# Patient Record
Sex: Male | Born: 1945 | Race: White | Hispanic: No | Marital: Married | Smoking: Former smoker
Health system: Southern US, Community
[De-identification: ages and names within clinical notes are randomized; demographics above are authoritative.]

## PROBLEM LIST (undated history)

## (undated) DIAGNOSIS — M1990 Unspecified osteoarthritis, unspecified site: Secondary | ICD-10-CM

## (undated) DIAGNOSIS — I639 Cerebral infarction, unspecified: Secondary | ICD-10-CM

## (undated) DIAGNOSIS — Z720 Tobacco use: Secondary | ICD-10-CM

## (undated) DIAGNOSIS — I714 Abdominal aortic aneurysm, without rupture, unspecified: Secondary | ICD-10-CM

## (undated) DIAGNOSIS — J449 Chronic obstructive pulmonary disease, unspecified: Secondary | ICD-10-CM

## (undated) HISTORY — PX: TONSILLECTOMY: SUR1361

## (undated) HISTORY — DX: Cerebral infarction, unspecified: I63.9

## (undated) HISTORY — DX: Chronic obstructive pulmonary disease, unspecified: J44.9

---

## 2015-06-14 ENCOUNTER — Encounter (HOSPITAL_COMMUNITY): Payer: Self-pay | Admitting: Emergency Medicine

## 2015-06-14 ENCOUNTER — Emergency Department (HOSPITAL_COMMUNITY): Payer: Medicare Other

## 2015-06-14 ENCOUNTER — Inpatient Hospital Stay (HOSPITAL_COMMUNITY)
Admission: EM | Admit: 2015-06-14 | Discharge: 2015-06-17 | DRG: 064 | Disposition: A | Discharge status: Home / Self Care | Payer: Medicare Other | Attending: Internal Medicine | Admitting: Internal Medicine

## 2015-06-14 DIAGNOSIS — J441 Chronic obstructive pulmonary disease with (acute) exacerbation: Secondary | ICD-10-CM | POA: Diagnosis not present

## 2015-06-14 DIAGNOSIS — I639 Cerebral infarction, unspecified: Secondary | ICD-10-CM | POA: Diagnosis not present

## 2015-06-14 DIAGNOSIS — F17210 Nicotine dependence, cigarettes, uncomplicated: Secondary | ICD-10-CM | POA: Diagnosis present

## 2015-06-14 DIAGNOSIS — R05 Cough: Secondary | ICD-10-CM | POA: Diagnosis present

## 2015-06-14 DIAGNOSIS — E785 Hyperlipidemia, unspecified: Secondary | ICD-10-CM | POA: Diagnosis present

## 2015-06-14 DIAGNOSIS — I10 Essential (primary) hypertension: Secondary | ICD-10-CM | POA: Diagnosis present

## 2015-06-14 DIAGNOSIS — R059 Cough, unspecified: Secondary | ICD-10-CM | POA: Diagnosis present

## 2015-06-14 DIAGNOSIS — R9431 Abnormal electrocardiogram [ECG] [EKG]: Secondary | ICD-10-CM | POA: Diagnosis present

## 2015-06-14 DIAGNOSIS — G9340 Encephalopathy, unspecified: Secondary | ICD-10-CM | POA: Diagnosis not present

## 2015-06-14 DIAGNOSIS — R4182 Altered mental status, unspecified: Secondary | ICD-10-CM | POA: Diagnosis not present

## 2015-06-14 DIAGNOSIS — R030 Elevated blood-pressure reading, without diagnosis of hypertension: Secondary | ICD-10-CM

## 2015-06-14 DIAGNOSIS — Z7982 Long term (current) use of aspirin: Secondary | ICD-10-CM

## 2015-06-14 DIAGNOSIS — J449 Chronic obstructive pulmonary disease, unspecified: Secondary | ICD-10-CM | POA: Diagnosis present

## 2015-06-14 DIAGNOSIS — IMO0001 Reserved for inherently not codable concepts without codable children: Secondary | ICD-10-CM | POA: Diagnosis present

## 2015-06-14 DIAGNOSIS — R410 Disorientation, unspecified: Secondary | ICD-10-CM | POA: Diagnosis not present

## 2015-06-14 DIAGNOSIS — Z720 Tobacco use: Secondary | ICD-10-CM

## 2015-06-14 DIAGNOSIS — E860 Dehydration: Secondary | ICD-10-CM | POA: Diagnosis present

## 2015-06-14 DIAGNOSIS — F17200 Nicotine dependence, unspecified, uncomplicated: Secondary | ICD-10-CM | POA: Diagnosis not present

## 2015-06-14 HISTORY — DX: Tobacco use: Z72.0

## 2015-06-14 LAB — COMPREHENSIVE METABOLIC PANEL
ALK PHOS: 76 U/L (ref 38–126)
ALT: 12 U/L — AB (ref 17–63)
AST: 18 U/L (ref 15–41)
Albumin: 4.6 g/dL (ref 3.5–5.0)
Anion gap: 11 (ref 5–15)
BILIRUBIN TOTAL: 0.6 mg/dL (ref 0.3–1.2)
BUN: 17 mg/dL (ref 6–20)
CALCIUM: 10.4 mg/dL — AB (ref 8.9–10.3)
CO2: 29 mmol/L (ref 22–32)
CREATININE: 0.83 mg/dL (ref 0.61–1.24)
Chloride: 104 mmol/L (ref 101–111)
Glucose, Bld: 149 mg/dL — ABNORMAL HIGH (ref 65–99)
Potassium: 4.3 mmol/L (ref 3.5–5.1)
Sodium: 144 mmol/L (ref 135–145)
Total Protein: 8 g/dL (ref 6.5–8.1)

## 2015-06-14 LAB — CBC
HCT: 48.3 % (ref 39.0–52.0)
Hemoglobin: 15.7 g/dL (ref 13.0–17.0)
MCH: 31.3 pg (ref 26.0–34.0)
MCHC: 32.5 g/dL (ref 30.0–36.0)
MCV: 96.4 fL (ref 78.0–100.0)
PLATELETS: 281 10*3/uL (ref 150–400)
RBC: 5.01 MIL/uL (ref 4.22–5.81)
RDW: 13.3 % (ref 11.5–15.5)
WBC: 9.3 10*3/uL (ref 4.0–10.5)

## 2015-06-14 LAB — URINALYSIS, ROUTINE W REFLEX MICROSCOPIC
Glucose, UA: NEGATIVE mg/dL
HGB URINE DIPSTICK: NEGATIVE
Ketones, ur: 15 mg/dL — AB
Leukocytes, UA: NEGATIVE
NITRITE: NEGATIVE
PH: 5.5 (ref 5.0–8.0)
Protein, ur: NEGATIVE mg/dL
SPECIFIC GRAVITY, URINE: 1.034 — AB (ref 1.005–1.030)

## 2015-06-14 LAB — TROPONIN I

## 2015-06-14 LAB — CBG MONITORING, ED: GLUCOSE-CAPILLARY: 118 mg/dL — AB (ref 65–99)

## 2015-06-14 LAB — BRAIN NATRIURETIC PEPTIDE: B Natriuretic Peptide: 175.9 pg/mL — ABNORMAL HIGH (ref 0.0–100.0)

## 2015-06-14 LAB — I-STAT CG4 LACTIC ACID, ED: Lactic Acid, Venous: 0.78 mmol/L (ref 0.5–2.0)

## 2015-06-14 LAB — PROTIME-INR
INR: 1.05 (ref 0.00–1.49)
PROTHROMBIN TIME: 13.9 s (ref 11.6–15.2)

## 2015-06-14 NOTE — ED Provider Notes (Signed)
CSN: [id number]     Arrival date & time 06/14/15  2011 History   First MD Initiated Contact with Patient 06/14/15 2147     Chief Complaint  Patient presents with  . Altered Mental Status  . Urinary Tract Infection    possible      (Consider location/radiation/quality/duration/timing/severity/associated sxs/prior Treatment) HPI Patient has seemed very tired and had periods of confusion since Sunday evening. The patient's wife reports that normally he is active and alert. He has done things like forgetting to turn off the television or unplug things that he normally would do. She reports that he also seemed somewhat confused in relating a rather simple recent event. Today he had difficulty using the television remote. There is not been a fever however he has been less active. His wife wonders if he has a UTI. The patient has noticed his urine to be dark or possibly of blood in it with some burning with urination. He is also had some mild headache has been coming and going. He denies any headache at this time. He denies of the headache localized. He reports it was "just a headache". There is been no vomiting or diarrhea. The patient is a baseline heavy smoker. His wife reports that he has not smoked for 2 days (NOT NORMAL FOR HIM). He has also had wet sounding cough. Patient denies chest pain. Not noted any rashes, there is been no swelling of the extremities or joints. Past Medical History  Diagnosis Date  . Tobacco abuse    History reviewed. No pertinent past surgical history. No family history on file. Social History  Substance Use Topics  . Smoking status: Current Every Day Smoker  . Smokeless tobacco: None  . Alcohol Use: None    Review of Systems  10 Systems reviewed and are negative for acute change except as noted in the HPI.   Allergies  Review of patient's allergies indicates no known allergies.  Home Medications   Prior to Admission medications   Medication Sig Start  Date End Date Taking? Authorizing Provider  aspirin EC 81 MG tablet Take 81 mg by mouth daily.   Yes Historical Provider, MD  Multiple Vitamins-Minerals (MULTIVITAMIN & MINERAL PO) Take 1 tablet by mouth daily.   Yes Historical Provider, MD   BP 154/95 mmHg  Pulse 83  Temp(Src) 98.2 F (36.8 C) (Oral)  Resp 14  SpO2 95% Physical Exam  Constitutional: He appears well-developed and well-nourished.  The patient is nontoxic and alert. He does not have acute respiratory distress.  HENT:  Head: Normocephalic and atraumatic.  Bilateral TMs no erythema or bulging. Posterior oropharynx is patent without erythema or exudate.  Eyes: EOM are normal. Pupils are equal, round, and reactive to light.  Neck: Neck supple.  Cardiovascular: Normal rate, regular rhythm, normal heart sounds and intact distal pulses.   Pulmonary/Chest: Effort normal.  Very soft breath sounds with poor air flow to the bases. With forced expiration wheeze. Patient has intermittent wet sounding cough.  Abdominal: Soft. Bowel sounds are normal. He exhibits no distension. There is no tenderness.  Musculoskeletal: Normal range of motion. He exhibits no edema or tenderness.  Neurological: He is alert. He has normal strength. Coordination normal. GCS eye subscore is 4. GCS verbal subscore is 5. GCS motor subscore is 6.  Patient does seem mildly confused. He answers questions but defers details to his wife. His speech seems somewhat slowed and with delayed word finding. Cranial nerves II through XII intact. Motor strength  intact 4 extremities. Intact to light touch 4 extremities and face.  Skin: Skin is warm, dry and intact.  Psychiatric: He has a normal mood and affect.    ED Course  Procedures (including critical care time) Labs Review Labs Reviewed  COMPREHENSIVE METABOLIC PANEL - Abnormal; Notable for the following:    Glucose, Bld 149 (*)    Calcium 10.4 (*)    ALT 12 (*)    All other components within normal limits   BRAIN NATRIURETIC PEPTIDE - Abnormal; Notable for the following:    B Natriuretic Peptide 175.9 (*)    All other components within normal limits  URINALYSIS, ROUTINE W REFLEX MICROSCOPIC (NOT AT [facility]) - Abnormal; Notable for the following:    Specific Gravity, Urine 1.034 (*)    Bilirubin Urine SMALL (*)    Ketones, ur 15 (*)    All other components within normal limits  CBG MONITORING, ED - Abnormal; Notable for the following:    Glucose-Capillary 118 (*)    All other components within normal limits  CULTURE, BLOOD (ROUTINE X 2)  CULTURE, BLOOD (ROUTINE X 2)  CBC  TROPONIN I  PROTIME-INR  URINE RAPID DRUG SCREEN, HOSP PERFORMED  BLOOD GAS, ARTERIAL  AMMONIA  I-STAT CG4 LACTIC ACID, ED    Imaging Review Dg Chest 2 View  06/14/2015  CLINICAL DATA:  70 year old male with cough EXAM: CHEST  2 VIEW COMPARISON:  None. FINDINGS: The heart size and mediastinal contours are within normal limits. Both lungs are clear. The visualized skeletal structures are unremarkable. IMPRESSION: No active cardiopulmonary disease. Electronically Signed   By: Elgie Collard M.D.   On: 06/14/2015 23:17   Ct Head Wo Contrast  06/14/2015  CLINICAL DATA:  70 year old male with altered mental status EXAM: CT HEAD WITHOUT CONTRAST TECHNIQUE: Contiguous axial images were obtained from the base of the skull through the vertex without intravenous contrast. COMPARISON:  None. FINDINGS: The ventricles are dilated and the sulci are prominent compatible with age-related atrophy. Periventricular and deep white matter hypodensities represent chronic microvascular ischemic changes. Bilateral basal ganglia hypodensities compatible with old infarcts. There is no intracranial hemorrhage. No mass effect or midline shift identified. In there is mild mucoperiosteal thickening of the paranasal sinuses. The mastoid air cells are well aerated. Calvarium is intact. IMPRESSION: No acute intracranial hemorrhage. Age-related atrophy and  chronic microvascular ischemic disease. If symptoms persist and there are no contraindications, MRI may provide better evaluation if clinically indicated. Electronically Signed   By: Elgie Collard M.D.   On: 06/14/2015 23:21   I have personally reviewed and evaluated these images and lab results as part of my medical decision-making.   EKG Interpretation   Date/Time:  Wednesday June 14 2015 20:25:23 EST Ventricular Rate:  94 PR Interval:  180 QRS Duration: 90 QT Interval:  339 QTC Calculation: 424 R Axis:   47 Text Interpretation:  Sinus rhythm Ventricular bigeminy Repol abnrm  suggests ischemia, inferior leads Baseline wander in lead(s) II III aVF  need repeat for baseline.no STEMI. Confirmed by Donnald Garre, MD, Lebron Conners  [phone]) on 06/15/2015 12:15:24 AM     Consult: Patient's case is been reviewed with Dr. Lu Duffel. Lu Duffel advises to obtain MRI and admit for mental status change. Consult: Patient's case reviewed with Dr. Youlanda Roys for admission. MDM   Final diagnoses:  Altered mental status, unspecified altered mental status type  Chronic obstructive pulmonary disease, unspecified COPD type (HCC)   Patient presents with waxing and waning mental status change over the  past 2-1/2 days. At this time patient has no localizing motor deficit. CT is not showing any acute infarct. Patient's case was reviewed Dr. Lu Duffel at this time we will admit the patient with mental status change for rule out a CVA versus TIA. Currently no infectious sources evident. Chest x-ray does not show focal pneumonia, patient does not have leukocytosis or elevated lactic acid and urinalysis is negative. There are no foci of cellulitis or apparent wounds. Time will be for ongoing monitoring and additional diagnostic evaluation.    Arby Barrette, MD 06/15/15 [phone]

## 2015-06-14 NOTE — ED Notes (Signed)
Pt from home. Wife thinks he has a UTI because pt has been lethargic and confused since Monday. Unremarkable neuro assessment. Pt has equal strength on both sides and equal dexterity. Pt has complaints of blood in his urine and burning when urinating. Pt has had complaints on headaches on and off since Monday.

## 2015-06-14 NOTE — ED Notes (Signed)
UANBLE TO COLLECT LABS AT THIS TIME PATIENT IS IN THE RESTROOM.

## 2015-06-15 ENCOUNTER — Inpatient Hospital Stay (HOSPITAL_COMMUNITY): Payer: Medicare Other

## 2015-06-15 ENCOUNTER — Encounter (HOSPITAL_COMMUNITY): Payer: Self-pay | Admitting: Internal Medicine

## 2015-06-15 DIAGNOSIS — IMO0001 Reserved for inherently not codable concepts without codable children: Secondary | ICD-10-CM | POA: Diagnosis present

## 2015-06-15 DIAGNOSIS — J449 Chronic obstructive pulmonary disease, unspecified: Secondary | ICD-10-CM | POA: Diagnosis present

## 2015-06-15 DIAGNOSIS — E860 Dehydration: Secondary | ICD-10-CM | POA: Diagnosis present

## 2015-06-15 DIAGNOSIS — I6789 Other cerebrovascular disease: Secondary | ICD-10-CM | POA: Diagnosis not present

## 2015-06-15 DIAGNOSIS — R05 Cough: Secondary | ICD-10-CM

## 2015-06-15 DIAGNOSIS — G9340 Encephalopathy, unspecified: Secondary | ICD-10-CM | POA: Diagnosis present

## 2015-06-15 DIAGNOSIS — J441 Chronic obstructive pulmonary disease with (acute) exacerbation: Secondary | ICD-10-CM | POA: Diagnosis present

## 2015-06-15 DIAGNOSIS — R9431 Abnormal electrocardiogram [ECG] [EKG]: Secondary | ICD-10-CM | POA: Diagnosis not present

## 2015-06-15 DIAGNOSIS — I10 Essential (primary) hypertension: Secondary | ICD-10-CM | POA: Diagnosis present

## 2015-06-15 DIAGNOSIS — I639 Cerebral infarction, unspecified: Secondary | ICD-10-CM | POA: Diagnosis not present

## 2015-06-15 DIAGNOSIS — R030 Elevated blood-pressure reading, without diagnosis of hypertension: Secondary | ICD-10-CM

## 2015-06-15 DIAGNOSIS — I63032 Cerebral infarction due to thrombosis of left carotid artery: Secondary | ICD-10-CM | POA: Diagnosis not present

## 2015-06-15 DIAGNOSIS — R059 Cough, unspecified: Secondary | ICD-10-CM | POA: Diagnosis present

## 2015-06-15 DIAGNOSIS — F17210 Nicotine dependence, cigarettes, uncomplicated: Secondary | ICD-10-CM | POA: Diagnosis present

## 2015-06-15 DIAGNOSIS — Z7982 Long term (current) use of aspirin: Secondary | ICD-10-CM | POA: Diagnosis not present

## 2015-06-15 DIAGNOSIS — Z720 Tobacco use: Secondary | ICD-10-CM

## 2015-06-15 DIAGNOSIS — R4182 Altered mental status, unspecified: Secondary | ICD-10-CM | POA: Diagnosis not present

## 2015-06-15 DIAGNOSIS — E785 Hyperlipidemia, unspecified: Secondary | ICD-10-CM | POA: Diagnosis present

## 2015-06-15 DIAGNOSIS — I63031 Cerebral infarction due to thrombosis of right carotid artery: Secondary | ICD-10-CM | POA: Diagnosis not present

## 2015-06-15 LAB — BLOOD GAS, ARTERIAL
Acid-Base Excess: 1.3 mmol/L (ref 0.0–2.0)
Bicarbonate: 25 mEq/L — ABNORMAL HIGH (ref 20.0–24.0)
DRAWN BY: 232811
FIO2: 0.21
O2 Saturation: 94.2 %
PH ART: 7.433 (ref 7.350–7.450)
Patient temperature: 98.2
TCO2: 21.9 mmol/L (ref 0–100)
pCO2 arterial: 38 mmHg (ref 35.0–45.0)
pO2, Arterial: 71 mmHg — ABNORMAL LOW (ref 80.0–100.0)

## 2015-06-15 LAB — RAPID URINE DRUG SCREEN, HOSP PERFORMED
Amphetamines: NOT DETECTED
BARBITURATES: NOT DETECTED
BENZODIAZEPINES: NOT DETECTED
COCAINE: NOT DETECTED
OPIATES: NOT DETECTED
TETRAHYDROCANNABINOL: NOT DETECTED

## 2015-06-15 LAB — EXPECTORATED SPUTUM ASSESSMENT W REFEX TO RESP CULTURE

## 2015-06-15 LAB — TROPONIN I: Troponin I: <0.03 ng/mL (ref <0.031)

## 2015-06-15 LAB — EXPECTORATED SPUTUM ASSESSMENT W GRAM STAIN, RFLX TO RESP C

## 2015-06-15 LAB — APTT: APTT: 44 s — AB (ref 24–37)

## 2015-06-15 LAB — MAGNESIUM: MAGNESIUM: 1.8 mg/dL (ref 1.7–2.4)

## 2015-06-15 LAB — LIPID PANEL
Cholesterol: 204 mg/dL — ABNORMAL HIGH (ref 0–200)
HDL: 37 mg/dL — ABNORMAL LOW (ref >40)
LDL CALC: 150 mg/dL — AB (ref 0–99)
TRIGLYCERIDES: 86 mg/dL (ref <150)
Total CHOL/HDL Ratio: 5.5 RATIO
VLDL: 17 mg/dL (ref 0–40)

## 2015-06-15 LAB — AMMONIA: AMMONIA: 25 umol/L (ref 9–35)

## 2015-06-15 MED ORDER — METOPROLOL TARTRATE 12.5 MG HALF TABLET
12.5000 mg | ORAL_TABLET | Freq: Two times a day (BID) | ORAL | Status: DC
  Administered 2015-06-16: 12.5 mg via ORAL
  Filled 2015-06-15: qty 1

## 2015-06-15 MED ORDER — AZITHROMYCIN 500 MG IV SOLR
500.0000 mg | INTRAVENOUS | Status: DC
  Administered 2015-06-15 – 2015-06-16 (×2): 500 mg via INTRAVENOUS
  Filled 2015-06-15 (×3): qty 500

## 2015-06-15 MED ORDER — STROKE: EARLY STAGES OF RECOVERY BOOK
Freq: Once | Status: AC
  Administered 2015-06-15: 10:00:00
  Filled 2015-06-15: qty 1

## 2015-06-15 MED ORDER — MULTIVITAMIN & MINERAL PO LIQD: 1.0000 | Freq: Every day | ORAL | Status: DC

## 2015-06-15 MED ORDER — ENOXAPARIN SODIUM 40 MG/0.4ML ~~LOC~~ SOLN
40.0000 mg | SUBCUTANEOUS | Status: DC
  Administered 2015-06-15 – 2015-06-17 (×3): 40 mg via SUBCUTANEOUS
  Filled 2015-06-15 (×3): qty 0.4

## 2015-06-15 MED ORDER — LORAZEPAM 2 MG/ML IJ SOLN
1.0000 mg | Freq: Once | INTRAMUSCULAR | Status: AC
  Administered 2015-06-15: 1 mg via INTRAVENOUS
  Filled 2015-06-15: qty 1

## 2015-06-15 MED ORDER — ALBUTEROL SULFATE (2.5 MG/3ML) 0.083% IN NEBU: 2.5000 mg | INHALATION_SOLUTION | RESPIRATORY_TRACT | Status: DC | PRN

## 2015-06-15 MED ORDER — HYDROMORPHONE HCL 1 MG/ML IJ SOLN: 0.5000 mg | Freq: Four times a day (QID) | INTRAMUSCULAR | Status: DC | PRN

## 2015-06-15 MED ORDER — AZITHROMYCIN 250 MG PO TABS: 500.0000 mg | ORAL_TABLET | Freq: Every day | ORAL | Status: DC

## 2015-06-15 MED ORDER — SENNOSIDES-DOCUSATE SODIUM 8.6-50 MG PO TABS: 1.0000 | ORAL_TABLET | Freq: Every evening | ORAL | Status: DC | PRN

## 2015-06-15 MED ORDER — ASPIRIN 325 MG PO TABS
325.0000 mg | ORAL_TABLET | Freq: Every day | ORAL | Status: DC
  Administered 2015-06-16: 325 mg via ORAL
  Filled 2015-06-15: qty 1

## 2015-06-15 MED ORDER — ASPIRIN 300 MG RE SUPP
300.0000 mg | Freq: Every day | RECTAL | Status: DC
  Administered 2015-06-15: 300 mg via RECTAL
  Filled 2015-06-15: qty 1

## 2015-06-15 MED ORDER — ACETAMINOPHEN 325 MG PO TABS: 650.0000 mg | ORAL_TABLET | Freq: Four times a day (QID) | ORAL | Status: DC | PRN

## 2015-06-15 MED ORDER — ASPIRIN EC 81 MG PO TBEC
81.0000 mg | DELAYED_RELEASE_TABLET | Freq: Once | ORAL | Status: AC
  Administered 2015-06-15: 81 mg via ORAL
  Filled 2015-06-15: qty 1

## 2015-06-15 MED ORDER — STROKE: EARLY STAGES OF RECOVERY BOOK
Freq: Once | Status: DC
  Filled 2015-06-15 (×2): qty 1

## 2015-06-15 MED ORDER — AZITHROMYCIN 250 MG PO TABS: 250.0000 mg | ORAL_TABLET | Freq: Every day | ORAL | Status: DC

## 2015-06-15 MED ORDER — DM-GUAIFENESIN ER 30-600 MG PO TB12
1.0000 | ORAL_TABLET | Freq: Two times a day (BID) | ORAL | Status: DC
  Administered 2015-06-16 – 2015-06-17 (×3): 1 via ORAL
  Filled 2015-06-15 (×6): qty 1

## 2015-06-15 MED ORDER — HYDRALAZINE HCL 20 MG/ML IJ SOLN: 5.0000 mg | INTRAMUSCULAR | Status: DC | PRN

## 2015-06-15 MED ORDER — NICOTINE 21 MG/24HR TD PT24
21.0000 mg | MEDICATED_PATCH | Freq: Every day | TRANSDERMAL | Status: DC
  Administered 2015-06-15 – 2015-06-17 (×3): 21 mg via TRANSDERMAL
  Filled 2015-06-15 (×3): qty 1

## 2015-06-15 MED ORDER — METHYLPREDNISOLONE SODIUM SUCC 125 MG IJ SOLR
60.0000 mg | Freq: Two times a day (BID) | INTRAMUSCULAR | Status: DC
  Administered 2015-06-15 – 2015-06-17 (×5): 60 mg via INTRAVENOUS
  Filled 2015-06-15 (×5): qty 2

## 2015-06-15 MED ORDER — IOHEXOL 350 MG/ML SOLN
150.0000 mL | Freq: Once | INTRAVENOUS | Status: AC | PRN
  Administered 2015-06-15: 75 mL via INTRAVENOUS

## 2015-06-15 MED ORDER — SODIUM CHLORIDE 0.9 % IV SOLN
INTRAVENOUS | Status: DC
  Administered 2015-06-15 (×2): via INTRAVENOUS

## 2015-06-15 MED ORDER — MAGNESIUM SULFATE 2 GM/50ML IV SOLN
2.0000 g | Freq: Once | INTRAVENOUS | Status: AC
  Administered 2015-06-15: 2 g via INTRAVENOUS
  Filled 2015-06-15: qty 50

## 2015-06-15 MED ORDER — IPRATROPIUM-ALBUTEROL 0.5-2.5 (3) MG/3ML IN SOLN
3.0000 mL | Freq: Three times a day (TID) | RESPIRATORY_TRACT | Status: DC
  Administered 2015-06-15 – 2015-06-16 (×4): 3 mL via RESPIRATORY_TRACT
  Filled 2015-06-15 (×4): qty 3

## 2015-06-15 MED ORDER — ADULT MULTIVITAMIN W/MINERALS CH
1.0000 | ORAL_TABLET | Freq: Every day | ORAL | Status: DC
  Administered 2015-06-16 – 2015-06-17 (×2): 1 via ORAL
  Filled 2015-06-15 (×2): qty 1

## 2015-06-15 MED ORDER — IPRATROPIUM-ALBUTEROL 0.5-2.5 (3) MG/3ML IN SOLN
3.0000 mL | RESPIRATORY_TRACT | Status: DC
  Administered 2015-06-15: 3 mL via RESPIRATORY_TRACT
  Filled 2015-06-15: qty 3

## 2015-06-15 NOTE — ED Notes (Signed)
Nurse drawing labs. 

## 2015-06-15 NOTE — Progress Notes (Signed)
Patient seen and examined, discussed with wife and daughter by the bedside  Altered mental status since Monday associated with a headache, slurred speech   Has not seen a PCP in 5 years  In the ER found to have acute CVA, shortness of breath, chest x-ray negative, CT head negative,ABG showed pH 7.433, PCO2 25 and PO2 71.  Had an MRI for confusion, found to have an acute CVA, neurology consulted  Plan #1 admit patient to [facility] for acute CVA, stroke order set initiated, on aspirin 81 mg at home, transition to Plavix?. Dr. Petra Kuba notified #2 COPD exacerbation, started patient on azithromycin 5 days, SLP evaluation for possible aspiration #3 hypertension-probably uncontrolled to begin with, will allow permissive hypertension in the setting of acute CVA, #4 dyslipidemia-start patient on a statin, LDL 150.

## 2015-06-15 NOTE — ED Notes (Signed)
Pt transported to MRI 

## 2015-06-15 NOTE — Progress Notes (Signed)
Patient listed as having Medicare insurance without a pcp.  EDCM spoke to patient and his family at bedside.  Patient confirms his pcp is located at [facility] at [place].  System updated.

## 2015-06-15 NOTE — ED Notes (Signed)
Paged MD concerning MRI results and new headache

## 2015-06-15 NOTE — Progress Notes (Signed)
Echocardiogram 2D Echocardiogram has been performed.  Jacob Mckenzie 06/15/2015, 3:16 PM

## 2015-06-15 NOTE — Progress Notes (Signed)
Order for swallow evaluation received due to concerns for possible aspiration. Given neurological event and aspiration concerns, rec proceed with MBS in am 06/16/15.  Xray made aware and orders placed.  SLP phoned ED at [phone] at [phone] but call was not answered.   Donavan Burnet, MS [facility] SLP [phone]

## 2015-06-15 NOTE — Progress Notes (Signed)
Patient transported to 5M17 via [facility]. Patient denies any pain. Patient placed on tele box 5M17. Bed alarm on and call bell within reach. Will continue to monitor.

## 2015-06-15 NOTE — Consult Note (Signed)
Neurology Consultation Reason for Consult: Stroke Referring Physician: Susie Cassette, N  CC: Altered mental status  History is obtained from: Wife  HPI: Jacob Mckenzie is a 70 y.o. male with a history of heavy tobacco abuse who presents with several days of altered mental status. He complained of being lightheaded on Sunday, but did not have any confusion. On Monday, his wife began noticing that he would do things that were bizarre. He would say things which were not pertinent to the situation at hand or were completely nonsensical in current context. This has been persistent and therefore yesterday, his wife took him into an urgent care who referred him in to  the emergency department.   LKW: Monday tpa given?: no, out of window Premorbid modified rankin scale: 0  ROS: A 14 point ROS was performed and is negative except as noted in the HPI.   Past Medical History  Diagnosis Date  . Tobacco abuse     FHx: Sister-cancer No history of stroke   Social History:  reports that he quit smoking 2 days ago. He does not have any smokeless tobacco history on file. He reports that he does not drink alcohol or use illicit drugs.   Exam: Current vital signs: BP 159/97 mmHg  Pulse 117  Temp(Src) 98.2 F (36.8 C) (Oral)  Resp 24  SpO2 93% Vital signs in last 24 hours: Temp:  [98.2 F (36.8 C)] 98.2 F (36.8 C) (01/19 0251) Pulse Rate:  [48-117] 117 (01/19 0740) Resp:  [14-27] 24 (01/19 0740) BP: (142-169)/(87-137) 159/97 mmHg (01/19 0740) SpO2:  [92 %-97 %] 93 % (01/19 0740)   Physical Exam  Constitutional: Appears well-developed and well-nourished.  Psych: Affect appropriate to situation Eyes: No scleral injection HENT: No OP obstrucion Head: Normocephalic.  Cardiovascular: Normal rate and regular rhythm.  Respiratory: Effort normal and breath sounds normal to anterior ascultation GI: Soft.  No distension. There is no tenderness.  Skin: WDI  Neuro: Mental Status: Patient is  awake, alert, oriented to person, place, month, year, and situation. Patient is able to give a clear and coherent history. No signs of aphasia or neglect He gives the number of quarters in $2.75 as 15 Cranial Nerves: II: Visual Fields are full. No signs of visual neglect Pupils are mildly unequal with the left very slightly larger, round, and reactive to light.   III,IV, VI: EOMI without ptosis or diploplia.  V: Facial sensation is symmetric to temperature VII: Facial movement is symmetric.  VIII: hearing is intact to voice X: Uvula elevates symmetrically XI: Shoulder shrug is symmetric. XII: tongue is midline without atrophy or fasciculations.  Motor: Tone is normal. Bulk is normal. 5/5 strength was present in all four extremities.  Sensory: Sensation is symmetric to light touch and temperature in the arms and legs. No signs of extinction to double simultaneous stimulation Cerebellar: FNF with mild postural and intentional tremor   I have reviewed labs in epic and the results pertinent to this consultation are: LDL-150  I have reviewed the images obtained: MRI brain-subcortical infarct on the right  Impression: 70 year old male with subcortical infarct which I suspect is due to untreated small vessel risk factors including heavy tobacco use, possible hypertension, hyperlippdemia. He'll need to be assessed by therapy and have secondary stroke prevention measures instituted.  Recommendations: 1. HgbA1c,  2. CTA head and neck.  3. Frequent neuro checks 4. Echocardiogram 5. Start statin therapy.  6. Prophylactic therapy-Antiplatelet med: Aspirin - dose  PO or  PR 7.  Risk factor modification 8. Telemetry monitoring 9. PT consult, OT consult, Speech consult    Ritta Slot, MD [facility] [phone]  If 7pm- 7am, please page neurology on call as listed in AMION.

## 2015-06-15 NOTE — ED Notes (Addendum)
Incorrect hosp. Orders, RN is correcting mistake, will reassess blood orders later

## 2015-06-15 NOTE — H&P (Signed)
[facility] History and Physical  [name] ZOX:[id number] DOB: Sep 19, 1945 DOA: 06/14/2015  Referring physician: ED physician PCP: No PCP Per Patient  Specialists:   Chief Complaint: Altered mental status, cough  HPI: Jacob Mckenzie is a 70 y.o. male with PMH of tobacco abuse, who presents with altered mental status and a cough.  The patient's patient's wife, he has been confused in the past 3 days. No unilateral weakness, numbness or tingling sensations. He has mild intermittent headache, but no vision change or hearing loss. No neck stiffness. Patient does not have fever or chills. Patient's wife think patient may have UTI, since she noticed possible blood in urine. Patient has a cough with yellow colored sputum production. No chest pain, shortness of breath, fever or chills. Patient does not have abdominal pain, diarrhea. Per patient's wife, patient has smoked 55 years, and quit smoking 2 days ago.  In ED, patient was found to have calcium 10.4, lactate 0.78, INR1.05, troponin negative, ammonium level 25, BNP 175, negative UDS, negative urinalysis, WBC 9.3, temperature normal, no tachycardia, electrolytes and renal function okay, negative chest x-ray, negative CT-head for acute abnormalities. ABG showed pH 7.433, PCO2 25 and PO2 71. Patient is admitted to inpatient for further eval and treatment. Neurology was consulted by EDP.  EKG: Independently reviewed. QTC 424, ventricular bigeminy.  Where does patient live?   At home    Can patient participate in ADLs?  Some   Review of Systems: Could not be reviewed accurately due to altered mental status Allergy: No Known Allergies  Past Medical History  Diagnosis Date  . Tobacco abuse     History reviewed. No pertinent past surgical history.  Social History:  reports that he quit smoking 2 days ago. He does not have any smokeless tobacco history on file. He reports that he does not drink alcohol or use illicit drugs.  Family  History: Could not be reviewed accurately due to altered mental status.  Prior to Admission medications   Medication Sig Start Date End Date Taking? Authorizing Provider  aspirin EC 81 MG tablet Take 81 mg by mouth daily.   Yes Historical Provider, MD  Multiple Vitamins-Minerals (MULTIVITAMIN & MINERAL PO) Take 1 tablet by mouth daily.   Yes Historical Provider, MD    Physical Exam: Filed Vitals:   06/15/15 0124 06/15/15 0230 06/15/15 0245 06/15/15 0251  BP:  142/109 142/109   Pulse:   90   Temp:    98.2 F (36.8 C)  TempSrc:      Resp:  24 26   SpO2: 96%  95%    General: Not in acute distress HEENT:       Eyes: PERRL, EOMI, no scleral icterus.       ENT: No discharge from the ears and nose, no pharynx injection, no tonsillar enlargement.        Neck: No JVD, no bruit, no mass felt. Heme: No neck lymph node enlargement. Cardiac: S1/S2, RRR, No murmurs, No gallops or rubs. Pulm:  Has wheezing bilaterally. No rales or rubs. Abd: Soft, nondistended, nontender, no rebound pain, no organomegaly, BS present. Ext: No pitting leg edema bilaterally. 2+DP/PT pulse bilaterally. Musculoskeletal: No joint deformities, No joint redness or warmth, no limitation of ROM in spin. Skin: No rashes.  Neuro: Alert, confused, but still oriented X3, cranial nerves II-XII grossly intact, muscle strength 5/5 in all extremities, sensation to light touch intact. Knee reflex 1+ bilaterally. Negative Babinski's sign. Normal finger to nose test. Neck supple. Psych:  Patient is not psychotic, no suicidal or hemocidal ideation.  Labs on Admission:  Basic Metabolic Panel:  Recent Labs Lab 06/14/15 2104  NA 144  K 4.3  CL 104  CO2 29  GLUCOSE 149*  BUN 17  CREATININE 0.83  CALCIUM 10.4*   Liver Function Tests:  Recent Labs Lab 06/14/15 2104  AST 18  ALT 12*  ALKPHOS 76  BILITOT 0.6  PROT 8.0  ALBUMIN 4.6   No results for input(s): LIPASE, AMYLASE in the last 168 hours.  Recent Labs Lab  06/15/15 0128  AMMONIA 25   CBC:  Recent Labs Lab 06/14/15 2104  WBC 9.3  HGB 15.7  HCT 48.3  MCV 96.4  PLT 281   Cardiac Enzymes:  Recent Labs Lab 06/14/15 2236 06/15/15 0159  TROPONINI <0.03 <0.03    BNP (last 3 results)  Recent Labs  06/14/15 2236  BNP 175.9*    ProBNP (last 3 results) No results for input(s): PROBNP in the last 8760 hours.  CBG:  Recent Labs Lab 06/14/15 2145  GLUCAP 118*    Radiological Exams on Admission: Dg Chest 2 View  06/14/2015  CLINICAL DATA:  70 year old male with cough EXAM: CHEST  2 VIEW COMPARISON:  None. FINDINGS: The heart size and mediastinal contours are within normal limits. Both lungs are clear. The visualized skeletal structures are unremarkable. IMPRESSION: No active cardiopulmonary disease. Electronically Signed   By: Elgie Collard M.D.   On: 06/14/2015 23:17   Ct Head Wo Contrast  06/14/2015  CLINICAL DATA:  70 year old male with altered mental status EXAM: CT HEAD WITHOUT CONTRAST TECHNIQUE: Contiguous axial images were obtained from the base of the skull through the vertex without intravenous contrast. COMPARISON:  None. FINDINGS: The ventricles are dilated and the sulci are prominent compatible with age-related atrophy. Periventricular and deep white matter hypodensities represent chronic microvascular ischemic changes. Bilateral basal ganglia hypodensities compatible with old infarcts. There is no intracranial hemorrhage. No mass effect or midline shift identified. In there is mild mucoperiosteal thickening of the paranasal sinuses. The mastoid air cells are well aerated. Calvarium is intact. IMPRESSION: No acute intracranial hemorrhage. Age-related atrophy and chronic microvascular ischemic disease. If symptoms persist and there are no contraindications, MRI may provide better evaluation if clinically indicated. Electronically Signed   By: Elgie Collard M.D.   On: 06/14/2015 23:21    Assessment/Plan Principal  Problem:   Acute encephalopathy Active Problems:   Tobacco abuse   COPD (chronic obstructive pulmonary disease) (HCC)   Cough   Abnormal EKG   Hypercalcemia   Elevated blood pressure  Acute encephalopathy: Etiology is not clear. No signs of infection. Differential diagnoses include TIA/stroke, cardiac arrhythmia given ventricular bigeminy on EKG, delirium Neurology was consulted by EDP. Dr. Roseanne Reno recommended MRI of the brain.  -Admitted to the telemetry bed -MRI of the brain was ordered by ED -Frequent neurochecks -Troponin 3 -Follow-up blood culture -ASA  Possible COPD and COPD exacerbation: Patient has smoked for 55 years, now presents with productive cough and wheezing on auscultation, indicating possible COPD and mild COPD exacerbation. -Nebulizers: scheduled Duoneb and prn albuterol -Solu-Medrol 60 mg IV  bid -Oral azithromycin for 5 days.  -Mucinex for cough  -Follow up blood culture x2, sputum culture -pt will need PFT as outpt  Hypercalcemia: Calcium 10.4, likely due to dehydration. Such mild elevation of calcium is unlikely to have caused altered mental status. -IV fluid: Normal saline 1 25 mL per hour -Follow-up by BMP  Elevated blood pressure:  Blood pressure is elevated on admission at 159/102. No history of hypertension -IV hydralazine when necessary   DVT ppx: SQ Lovenox  Code Status: Full code Family Communication:   Yes, patient's  wife at bed side Disposition Plan: Admit to inpatient   Date of Service 06/15/2015    Lorretta Harp [facility] Pager [phone]  If 7PM-7AM, please contact night-coverage [email or web address] Password [facility] 06/15/2015, 4:48 AM

## 2015-06-15 NOTE — ED Notes (Signed)
MD notified that MRI is not available at [facility] during the night. MD verbalized pt was stable enough to wait until morning

## 2015-06-16 ENCOUNTER — Inpatient Hospital Stay (HOSPITAL_COMMUNITY): Payer: Medicare Other

## 2015-06-16 DIAGNOSIS — I639 Cerebral infarction, unspecified: Principal | ICD-10-CM

## 2015-06-16 LAB — COMPREHENSIVE METABOLIC PANEL
ALK PHOS: 64 U/L (ref 38–126)
ALT: 13 U/L — AB (ref 17–63)
AST: 30 U/L (ref 15–41)
Albumin: 3.4 g/dL — ABNORMAL LOW (ref 3.5–5.0)
Anion gap: 8 (ref 5–15)
BILIRUBIN TOTAL: 0.3 mg/dL (ref 0.3–1.2)
BUN: 10 mg/dL (ref 6–20)
CALCIUM: 9.4 mg/dL (ref 8.9–10.3)
CO2: 24 mmol/L (ref 22–32)
CREATININE: 0.76 mg/dL (ref 0.61–1.24)
Chloride: 108 mmol/L (ref 101–111)
Glucose, Bld: 153 mg/dL — ABNORMAL HIGH (ref 65–99)
Potassium: 4.3 mmol/L (ref 3.5–5.1)
Sodium: 140 mmol/L (ref 135–145)
Total Protein: 6.4 g/dL — ABNORMAL LOW (ref 6.5–8.1)

## 2015-06-16 LAB — HEMOGLOBIN A1C
Hgb A1c MFr Bld: 5.6 % (ref 4.8–5.6)
Mean Plasma Glucose: 114 mg/dL

## 2015-06-16 MED ORDER — ATORVASTATIN CALCIUM 40 MG PO TABS
40.0000 mg | ORAL_TABLET | Freq: Every day | ORAL | Status: DC
  Administered 2015-06-16: 40 mg via ORAL
  Filled 2015-06-16: qty 1

## 2015-06-16 MED ORDER — CLOPIDOGREL BISULFATE 75 MG PO TABS
75.0000 mg | ORAL_TABLET | Freq: Every day | ORAL | Status: DC
  Administered 2015-06-16 – 2015-06-17 (×2): 75 mg via ORAL
  Filled 2015-06-16 (×2): qty 1

## 2015-06-16 MED ORDER — METOPROLOL TARTRATE 25 MG PO TABS
25.0000 mg | ORAL_TABLET | Freq: Two times a day (BID) | ORAL | Status: DC
  Administered 2015-06-16 – 2015-06-17 (×2): 25 mg via ORAL
  Filled 2015-06-16 (×2): qty 1

## 2015-06-16 MED ORDER — IPRATROPIUM-ALBUTEROL 0.5-2.5 (3) MG/3ML IN SOLN: 3.0000 mL | Freq: Four times a day (QID) | RESPIRATORY_TRACT | Status: DC | PRN

## 2015-06-16 MED ORDER — IPRATROPIUM-ALBUTEROL 0.5-2.5 (3) MG/3ML IN SOLN
3.0000 mL | Freq: Four times a day (QID) | RESPIRATORY_TRACT | Status: DC
  Filled 2015-06-16: qty 3

## 2015-06-16 NOTE — Progress Notes (Signed)
PT Cancellation Note  Patient Details Name: Jacob Mckenzie MRN: [id number] DOB: [birth year]   Cancelled Treatment:    Reason Eval/Treat Not Completed: Medical issues which prohibited therapy;Patient not medically ready.  Per orders, patient is on bedrest.  MD:  Please clarify bedrest/activity orders.  PT will initiate evaluation at that time.  Thank you   Vena Austria 06/16/2015, 10:46 AM Durenda Hurt. Renaldo Fiddler, [facility] Acute Rehab Services Pager [phone]

## 2015-06-16 NOTE — Progress Notes (Signed)
Pt refused labs. Pt was educated and encouraged to comply by family at bedside but he continued to refuse. Md text paged to notify.

## 2015-06-16 NOTE — Progress Notes (Signed)
STROKE TEAM PROGRESS NOTE   HISTORY OF PRESENT ILLNESS Jacob Mckenzie is a 70 y.o. male with a history of heavy tobacco abuse who presents with several days of altered mental status. He complained of being lightheaded on Sunday, but did not have any confusion. On Monday, his wife began noticing that he would do things that were bizarre. He would say things which were not pertinent to the situation at hand or were completely nonsensical in current context. This has been persistent and therefore yesterday, his wife took him into an urgent care who referred him in to the emergency department.   LKW: Monday tpa given?: no, out of window Premorbid modified rankin scale: 0   SUBJECTIVE (INTERVAL HISTORY) Multiple family members present. The patient did not sleep well last night and is currently difficult to arouse. He does respond to painful stimulus and loud voice commands. Dr. Pearlean Brownie discussed the patient's case with his wife and children. He recommended Plavix therapy and risk factor modification.   OBJECTIVE Temp:  [97.7 F (36.5 C)-98.1 F (36.7 C)] 98.1 F (36.7 C) (01/20 0609) Pulse Rate:  [67-117] 87 (01/20 0609) Cardiac Rhythm:  [-] Sinus tachycardia (01/19 1900) Resp:  [18-31] 20 (01/20 0609) BP: (143-175)/(79-116) 165/95 mmHg (01/20 0609) SpO2:  [92 %-100 %] 98 % (01/20 0609) Weight:  [73.029 kg (161 lb)] 73.029 kg (161 lb) (01/19 2000)  CBC:  Recent Labs Lab 06/14/15 2104  WBC 9.3  HGB 15.7  HCT 48.3  MCV 96.4  PLT 281    Basic Metabolic Panel:  Recent Labs Lab 06/14/15 2104 06/15/15 1329  NA 144  --   K 4.3  --   CL 104  --   CO2 29  --   GLUCOSE 149*  --   BUN 17  --   CREATININE 0.83  --   CALCIUM 10.4*  --   MG  --  1.8    Lipid Panel:    Component Value Date/Time   CHOL 204* 06/15/2015 0607   TRIG 86 06/15/2015 0607   HDL 37* 06/15/2015 0607   CHOLHDL 5.5 06/15/2015 0607   VLDL 17 06/15/2015 0607   LDLCALC 150* 06/15/2015 0607   HgbA1c:  Lab  Results  Component Value Date   HGBA1C 5.6 06/15/2015   Urine Drug Screen:    Component Value Date/Time   LABOPIA NONE DETECTED 06/14/2015 2250   COCAINSCRNUR NONE DETECTED 06/14/2015 2250   LABBENZ NONE DETECTED 06/14/2015 2250   AMPHETMU NONE DETECTED 06/14/2015 2250   THCU NONE DETECTED 06/14/2015 2250   LABBARB NONE DETECTED 06/14/2015 2250      IMAGING  Ct Angio Head and Neck W/cm &/or Wo Cm 06/15/2015   1. Acute/subacute infarct within the anterior right lentiform nucleus extending into the centrum semiovale.  2. Remote infarct of the left lentiform nucleus.  3. Moderate atrophy and white matter disease.  4. 4 vessel arch configuration.  5. Atherosclerotic changes at the right carotid bifurcation without significant stenosis.  6. Changes of the left carotid bifurcation suggesting previous endarterectomy. There is no residual or recurrent stenosis.  7. Atherosclerotic changes within the proximal basilar artery with mild narrowing of less than 50%.  8. Moderate medium and distal small vessel disease bilaterally without a significant proximal stenosis or occlusion within the branch vessels.  9. Moderate spondylosis in the cervical spine.     Dg Chest 2 View 06/14/2015   No active cardiopulmonary disease.     Ct Head Wo Contrast 06/14/2015  No acute intracranial hemorrhage. Age-related atrophy and chronic microvascular ischemic disease.    Mr Brain Wo Contrast (neuro Protocol) 06/15/2015   1. Acute/subacute nonhemorrhagic 4 cm infarct involving the right lentiform nucleus extends superiorly into the centrum semiovale with possible involvement of the anterior limb right internal capsule.  2. Remote infarct of the left lentiform nucleus a similar location.  3. Moderate age advanced periventricular and subcortical white matter disease bilaterally likely reflects the sequela of chronic microvascular ischemia.     PHYSICAL EXAM  Elderly Caucasian male who is sleeping. .  Afebrile. Head is nontraumatic. Neck is supple without bruit.    Cardiac exam no murmur or gallop. Lungs are clear to auscultation. Distal pulses are well felt. Neurological Exam ;  Sleepy and hard to arouse will follow simple commands. Normal speech and language.eye movements full without nystagmus.fundi were not visualized. Vision acuity and fields appear normal. Hearing is normal. Palatal movements are normal. Face symmetric. Tongue midline. Normal strength, tone, reflexes and coordination. Normal sensation. Gait deferred.      ASSESSMENT/PLAN [name] is a 70 y.o. male with history of tobacco use, previous left carotid endarterectomy, and no regular medical care presenting with confusion. He did not receive IV t-PA due to late presentation.  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  Resultant - altered mental status.  MRI - Acute/subacute nonhemorrhagic 4 cm infarct involving the right lentiform nucleus. Remote infarct noted.  MRA - chronic microvascular ischemia.  Carotid Doppler - refer to CT angiogram of the neck  CTA - less than 50% basilar artery stenosis. Previous left carotid endarterectomy.  2D Echo - EF 50-55%. No cardiac source of emboli identified.  LDL - 150  HgbA1c 5.6  VTE prophylaxis - Lovenox  Diet NPO time specified  aspirin 81 mg daily prior to admission, now on clopidogrel 75 mg daily  Patient counseled to be compliant with his antithrombotic medications  Ongoing aggressive stroke risk factor management  Therapy recommendations: - Pending  Disposition:  Pending  Hypertension  Blood pressure is elevated  Permissive hypertension (OK if < 220/120) but gradually normalize in 5-7 days  Hyperlipidemia  Home meds:  No lipid lowering medications prior to admission.  LDL 150, goal < 70  Now on Lipitor 40 mg daily.  Continue statin at discharge    Other Stroke Risk Factors  Advanced age  Cigarette smoker,  quit smoking 3 days ago.  Hx stroke/TIA    Other Active Problems  Poorly cooperative  Hospital day # 1  Delton See PA-C [facility] Pager [phone] 06/16/2015, 3:49 PM  I have personally examined this patient, reviewed notes, independently viewed imaging studies, participated in medical decision making and plan of care. I have made any additions or clarifications directly to the above note. Agree with note above.He/  has presented with. 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  remains at risk for neurological worsening, recurrent stroke/TIAs and needs ongoing stroke evaluation and aggressive risk factor modification D/W family at bedside and answered questions  Delia Heady, MD Medical Director [facility] Pager: [phone] 06/16/2015 6:54 PM    To contact Stroke Continuity provider, please refer to [email or web address]. After hours, contact General Neurology

## 2015-06-16 NOTE — Progress Notes (Signed)
[facility]                                                                              Patient Demographics  Garon Melander, is a 70 y.o. male, DOB - 12-04-1945, ZOX:[id number]  Admit date - 06/14/2015   Admitting Physician Richarda Overlie, MD  Outpatient Primary MD for the patient is PROVIDER NOT IN SYSTEM  LOS - 1   Chief Complaint  Patient presents with  . Altered Mental Status  . Urinary Tract Infection    possible        Brief HPI   Makai Dumond is a 70 y.o. male with PMH of tobacco abuse, who presents with altered mental status and a cough. Per patient's wife, he has been confused in the past 3 days. No unilateral weakness, numbness or tingling sensations. He has mild intermittent headache, but no vision change or hearing loss. No neck stiffness. Patient does not have fever or chills. Patient's wife think patient may have UTI, since she noticed possible blood in urine. Patient has a cough with yellow colored sputum production. No chest pain, shortness of breath, fever or chills. Patient does not have abdominal pain, diarrhea. Per patient's wife, patient has smoked 55 years, and quit smoking 2 days ago. In ED, patient was found to have calcium 10.4, lactate 0.78, INR1.05, troponin negative, ammonium level 25, BNP 175, negative UDS, negative urinalysis, WBC 9.3, temperature normal, no tachycardia, electrolytes and renal function okay, negative chest x-ray, negative CT-head for acute abnormalities. ABG showed pH 7.433, PCO2 25 and PO2 71. Patient is admitted to inpatient for further eval and treatment. Neurology was consulted by EDP. EKG: Independently reviewed. QTC 424, ventricular bigeminy.   Assessment & Plan   Acute CVA- presenting with acute encephalopathy - Delayed presentation hence not a TPA candidate. Neurology was consulted. - MRI brain showed acute/subacute nonhemorrhagic 4 cm infarct involving the right lentiform nucleus extending superiorly into the  centrum semiovale with possible involvement of the anterior limb right internal capsule, remote infarct of the left lentiform nucleus - CTA head and neck showed acute/subacute infarct in the anterior right lentiform nucleus extending to the Centrum semi-ovale, moderate atrophy and white matter disease, atherosclerotic changes in the proximal basilar artery with mild narrowing less than 50%, moderate medium, distal small vessel disease bilaterally. - 2-D echo pending - Carotid Dopplers pending - Neurology recommended stroke workup - Lipid panel showed LDL 150, goal less than 70, placed on statin - Hemoglobin A1c 5.6 - PT evaluation pending    Possible COPD and COPD exacerbation: Patient has smoked for 55 years, now presented with productive cough and wheezing on auscultation, indicating possible COPD and mild COPD exacerbation. - Continue scheduled Nebulizers, IV Solu-Medrol, Zithromax, Mucinex - Follow up blood culture x2, sputum culture - pt will need PFT as outpt  Hypercalcemia: Calcium 10.4, likely due to dehydration.  -Patient received IV fluids, recheck BMET  Elevated blood pressure:  - BP elevated, permissive hypertension due to acute stroke, for now increased metoprolol  -IV hydralazine when necessary  Code Status: full code  Family Communication: Discussed in detail with the patient, all imaging results,  lab results explained to the patient and wife  Disposition Plan:   Time Spent in minutes   25 minutes  Procedures  MRI brain CTA   Consults   neuro  DVT Prophylaxis  Lovenox   Medications  Scheduled Meds: . aspirin  300 mg Rectal Daily   Or  . aspirin  325 mg Oral Daily  . atorvastatin  40 mg Oral q1800  . azithromycin  500 mg Intravenous Q24H  . dextromethorphan-guaiFENesin  1 tablet Oral BID  . enoxaparin (LOVENOX) injection  40 mg Subcutaneous Q24H  . ipratropium-albuterol  3 mL Nebulization Q6H  . methylPREDNISolone (SOLU-MEDROL) injection  60 mg  Intravenous Q12H  . metoprolol tartrate  25 mg Oral BID  . multivitamin with minerals  1 tablet Oral Daily  . nicotine  21 mg Transdermal Daily   Continuous Infusions: . sodium chloride 125 mL/hr at 06/15/15 1015   PRN Meds:.acetaminophen, albuterol, hydrALAZINE, HYDROmorphone (DILAUDID) injection, senna-docusate   Antibiotics   Anti-infectives    Start     Dose/Rate Route Frequency Ordered Stop   06/16/15 1000  azithromycin (ZITHROMAX) tablet 250 mg  Status:  Discontinued     250 mg Oral Daily 06/15/15 0114 06/15/15 0953   06/15/15 1000  azithromycin (ZITHROMAX) tablet 500 mg  Status:  Discontinued     500 mg Oral Daily 06/15/15 0114 06/15/15 0953   06/15/15 1000  azithromycin (ZITHROMAX) 500 mg in dextrose 5 % 250 mL IVPB     500 mg 250 mL/hr over 60 Minutes Intravenous Every 24 hours 06/15/15 0953          Subjective:   Leviticus Eide was seen and examined today.  Still very confused, wife at the bedside. No chest pain, fevers or chills. + Coughing .  Patient denies  abdominal pain, N/V/D/C, new weakness, numbess, tingling. No acute events overnight.    Objective:   Blood pressure 157/105, pulse 89, temperature 98.2 F (36.8 C), temperature source Oral, resp. rate 20, height  (1.803 m), weight 73.029 kg (161 lb), SpO2 92 %.  Wt Readings from Last 3 Encounters:  06/15/15 73.029 kg (161 lb)     Intake/Output Summary (Last 24 hours) at 06/16/15 1354 Last data filed at 06/15/15 2100  Gross per 24 hour  Intake      0 ml  Output    100 ml  Net   -100 ml    Exam  General: Alert and oriented x2, NAD  HEENT:  PERRLA, EOMI, Anicteric Sclera, mucous membranes moist.   Neck: Supple, no JVD, no masses  CVS: S1 S2 auscultated, no rubs, murmurs or gallops. Regular rate and rhythm.  Respiratory: Clear to auscultation bilaterally, no wheezing, rales or rhonchi  Abdomen: Soft, nontender, nondistended, + bowel sounds  Ext: no cyanosis clubbing or edema  Neuro:  AAOx2 Cr N's II- XII. Strength 5/5 upper and lower extremities bilaterally  Skin: No rashes  Psych: Normal affect and demeanor, alert and oriented x2   Data Review   Micro Results Recent Results (from the past 240 hour(s))  Culture, blood (routine x 2)     Status: None (Preliminary result)   Collection Time: 06/14/15 10:36 PM  Result Value Ref Range Status   Specimen Description BLOOD RIGHT ANTECUBITAL  Final   Special Requests BOTTLES DRAWN AEROBIC AND ANAEROBIC 5 ML  Final   Culture   Final    NO GROWTH < 24 HOURS Performed at [facility]    Report Status PENDING  Incomplete  Culture, expectorated sputum-assessment     Status: None   Collection Time: 06/15/15 11:44 AM  Result Value Ref Range Status   Specimen Description SPUTUM  Final   Special Requests NONE  Final   Sputum evaluation   Final    THIS SPECIMEN IS ACCEPTABLE. RESPIRATORY CULTURE REPORT TO FOLLOW.   Report Status 06/15/2015 FINAL  Final  Culture, respiratory (NON-Expectorated)     Status: None (Preliminary result)   Collection Time: 06/15/15 11:44 AM  Result Value Ref Range Status   Specimen Description SPUTUM  Final   Special Requests NONE  Final   Gram Stain   Final    NO WBC SEEN FEW SQUAMOUS EPITHELIAL CELLS PRESENT FEW GRAM POSITIVE COCCI IN PAIRS FEW GRAM NEGATIVE RODS Performed at [facility]    Culture PENDING  Incomplete   Report Status PENDING  Incomplete    Radiology Reports Ct Angio Head W/cm &/or Wo Cm  06/15/2015  CLINICAL DATA:  Right basal ganglia infarct. EXAM: CT ANGIOGRAPHY HEAD AND NECK TECHNIQUE: Multidetector CT imaging of the head and neck was performed using the standard protocol during bolus administration of intravenous contrast. Multiplanar CT image reconstructions and MIPs were obtained to evaluate the vascular anatomy. Carotid stenosis measurements (when applicable) are obtained utilizing NASCET criteria, using the distal internal carotid diameter as the  denominator. CONTRAST:  75mL OMNIPAQUE IOHEXOL 350 MG/ML SOLN COMPARISON:  MRI brain from the same day. FINDINGS: CT HEAD Brain: The acute/ subacute anterior lentiform nucleus infarct extending superiorly in in the centrum semiovale is again noted without significant interval change. A similar remote infarct of the left lentiform nucleus is again noted. Moderate periventricular white matter changes are present. The source images demonstrate no other acute infarct. Calvarium and skull base: Intact Paranasal sinuses: Circumferential mucosal disease is present in the left maxillary sinus. Mild anterior ethmoid and right frontal sinus disease is present. The mastoid air cells are clear. Orbits: Within normal limits CTA NECK Aortic arch: A 4 vessel arch configuration is present. The left vertebral artery originates from the aortic arch. There is no significant stenosis from the origins of the great vessels. Right carotid system: The right common carotid artery is within normal limits. Atherosclerotic changes are present at the carotid bifurcation and distal right common carotid artery without a significant stenosis relative to the more distal vessel. There is focal tortuosity in the proximal right internal carotid artery without a significant stenosis. Left carotid system: The left common carotid artery is within normal limits. Dilation through the bifurcation suggests a previous endarterectomy. There is no residual or recurrent stenosis. The distal cervical left ICA is within normal limits. Vertebral arteries:The vertebral arteries enter into the spinal canal at C4-5 bilaterally, a normal variant. The right vertebral artery is the dominant vessel. There are no tandem stenoses in the neck. Skeleton: Prominent posterior endplate osteophytes and uncovertebral spurring is present at C3-4. Degenerative anterolisthesis present at C5-6 with chronic loss of height at C5-6, C6-7, and C7-T1. There is chronic loss of disc height  throughout the upper thoracic spine. Patient is edentulous. The mandible is intact and located. No focal lytic or blastic lesions are present. Other neck: The soft tissues of the neck are unremarkable. The salivary glands are within normal limits. No focal mucosal or submucosal lesions are present. There is no significant adenopathy. The thyroid is within normal limits. The lung apices are clear. CTA HEAD Anterior circulation: Atherosclerotic changes are present within the cavernous internal carotid arteries  bilaterally without a significant stenosis. The ICA termini are within normal limits bilaterally. The A1 and M1 segments are within normal limits. The anterior communicating artery is patent. The MCA bifurcations are intact. Moderate mid and distal MCA branch vessel stenoses are present bilaterally without a significant proximal stenosis or occlusion. Posterior circulation: The right vertebral artery is the dominant vessel. The PICA origins are not visualized. Prominent AICA vessels are present. Atherosclerotic changes are present in the basilar artery without a significant stenosis. The posterior cerebral arteries originate from the basilar tip. Posterior communicating arteries are present bilaterally. There is moderate attenuation of distal stool PCA branch vessels bilaterally. Venous sinuses: Flow is present in the dural sinuses. The right transverse sinus is dominant. The straight sinus and deep cerebral veins are patent. The cortical veins are intact. Anatomic variants: Posterior communicating arteries are present bilaterally. Delayed phase: The postcontrast images demonstrate no pathologic enhancement. IMPRESSION: 1. Acute/subacute infarct within the anterior right lentiform nucleus extending into the centrum semiovale. 2. Remote infarct of the left lentiform nucleus. 3. Moderate atrophy and white matter disease. 4. 4 vessel arch configuration. 5. Atherosclerotic changes at the right carotid bifurcation  without significant stenosis. 6. Changes of the left carotid bifurcation suggesting previous endarterectomy. There is no residual or recurrent stenosis. 7. Atherosclerotic changes within the proximal basilar artery with mild narrowing of less than 50%. 8. Moderate medium and distal small vessel disease bilaterally without a significant proximal stenosis or occlusion within the branch vessels. 9. Moderate spondylosis in the cervical spine. Electronically Signed   By: Marin Roberts M.D.   On: 06/15/2015 11:58   Dg Chest 2 View  06/14/2015  CLINICAL DATA:  70 year old male with cough EXAM: CHEST  2 VIEW COMPARISON:  None. FINDINGS: The heart size and mediastinal contours are within normal limits. Both lungs are clear. The visualized skeletal structures are unremarkable. IMPRESSION: No active cardiopulmonary disease. Electronically Signed   By: Elgie Collard M.D.   On: 06/14/2015 23:17   Ct Head Wo Contrast  06/14/2015  CLINICAL DATA:  70 year old male with altered mental status EXAM: CT HEAD WITHOUT CONTRAST TECHNIQUE: Contiguous axial images were obtained from the base of the skull through the vertex without intravenous contrast. COMPARISON:  None. FINDINGS: The ventricles are dilated and the sulci are prominent compatible with age-related atrophy. Periventricular and deep white matter hypodensities represent chronic microvascular ischemic changes. Bilateral basal ganglia hypodensities compatible with old infarcts. There is no intracranial hemorrhage. No mass effect or midline shift identified. In there is mild mucoperiosteal thickening of the paranasal sinuses. The mastoid air cells are well aerated. Calvarium is intact. IMPRESSION: No acute intracranial hemorrhage. Age-related atrophy and chronic microvascular ischemic disease. If symptoms persist and there are no contraindications, MRI may provide better evaluation if clinically indicated. Electronically Signed   By: Elgie Collard M.D.   On:  06/14/2015 23:21   Ct Angio Neck W/cm &/or Wo/cm  06/15/2015  CLINICAL DATA:  Right basal ganglia infarct. EXAM: CT ANGIOGRAPHY HEAD AND NECK TECHNIQUE: Multidetector CT imaging of the head and neck was performed using the standard protocol during bolus administration of intravenous contrast. Multiplanar CT image reconstructions and MIPs were obtained to evaluate the vascular anatomy. Carotid stenosis measurements (when applicable) are obtained utilizing NASCET criteria, using the distal internal carotid diameter as the denominator. CONTRAST:  75mL OMNIPAQUE IOHEXOL 350 MG/ML SOLN COMPARISON:  MRI brain from the same day. FINDINGS: CT HEAD Brain: The acute/ subacute anterior lentiform nucleus infarct extending superiorly in in  the centrum semiovale is again noted without significant interval change. A similar remote infarct of the left lentiform nucleus is again noted. Moderate periventricular white matter changes are present. The source images demonstrate no other acute infarct. Calvarium and skull base: Intact Paranasal sinuses: Circumferential mucosal disease is present in the left maxillary sinus. Mild anterior ethmoid and right frontal sinus disease is present. The mastoid air cells are clear. Orbits: Within normal limits CTA NECK Aortic arch: A 4 vessel arch configuration is present. The left vertebral artery originates from the aortic arch. There is no significant stenosis from the origins of the great vessels. Right carotid system: The right common carotid artery is within normal limits. Atherosclerotic changes are present at the carotid bifurcation and distal right common carotid artery without a significant stenosis relative to the more distal vessel. There is focal tortuosity in the proximal right internal carotid artery without a significant stenosis. Left carotid system: The left common carotid artery is within normal limits. Dilation through the bifurcation suggests a previous endarterectomy. There  is no residual or recurrent stenosis. The distal cervical left ICA is within normal limits. Vertebral arteries:The vertebral arteries enter into the spinal canal at C4-5 bilaterally, a normal variant. The right vertebral artery is the dominant vessel. There are no tandem stenoses in the neck. Skeleton: Prominent posterior endplate osteophytes and uncovertebral spurring is present at C3-4. Degenerative anterolisthesis present at C5-6 with chronic loss of height at C5-6, C6-7, and C7-T1. There is chronic loss of disc height throughout the upper thoracic spine. Patient is edentulous. The mandible is intact and located. No focal lytic or blastic lesions are present. Other neck: The soft tissues of the neck are unremarkable. The salivary glands are within normal limits. No focal mucosal or submucosal lesions are present. There is no significant adenopathy. The thyroid is within normal limits. The lung apices are clear. CTA HEAD Anterior circulation: Atherosclerotic changes are present within the cavernous internal carotid arteries bilaterally without a significant stenosis. The ICA termini are within normal limits bilaterally. The A1 and M1 segments are within normal limits. The anterior communicating artery is patent. The MCA bifurcations are intact. Moderate mid and distal MCA branch vessel stenoses are present bilaterally without a significant proximal stenosis or occlusion. Posterior circulation: The right vertebral artery is the dominant vessel. The PICA origins are not visualized. Prominent AICA vessels are present. Atherosclerotic changes are present in the basilar artery without a significant stenosis. The posterior cerebral arteries originate from the basilar tip. Posterior communicating arteries are present bilaterally. There is moderate attenuation of distal stool PCA branch vessels bilaterally. Venous sinuses: Flow is present in the dural sinuses. The right transverse sinus is dominant. The straight sinus and  deep cerebral veins are patent. The cortical veins are intact. Anatomic variants: Posterior communicating arteries are present bilaterally. Delayed phase: The postcontrast images demonstrate no pathologic enhancement. IMPRESSION: 1. Acute/subacute infarct within the anterior right lentiform nucleus extending into the centrum semiovale. 2. Remote infarct of the left lentiform nucleus. 3. Moderate atrophy and white matter disease. 4. 4 vessel arch configuration. 5. Atherosclerotic changes at the right carotid bifurcation without significant stenosis. 6. Changes of the left carotid bifurcation suggesting previous endarterectomy. There is no residual or recurrent stenosis. 7. Atherosclerotic changes within the proximal basilar artery with mild narrowing of less than 50%. 8. Moderate medium and distal small vessel disease bilaterally without a significant proximal stenosis or occlusion within the branch vessels. 9. Moderate spondylosis in the cervical spine. Electronically Signed  By: Marin Roberts M.D.   On: 06/15/2015 11:58   Mr Brain Wo Contrast (neuro Protocol)  06/15/2015  CLINICAL DATA:  Confusion over the last 3 days. Generalized weakness. Mental status change. EXAM: MRI HEAD WITHOUT CONTRAST TECHNIQUE: Multiplanar, multiecho pulse sequences of the brain and surrounding structures were obtained without intravenous contrast. COMPARISON:  CT head without contrast 06/14/2015. FINDINGS: The diffusion-weighted images demonstrate an acute nonhemorrhagic infarct involving the right lentiform nucleus and extending superiorly within the centrum semiovale. There is probable involvement of the anterior limb of the right internal capsule. The right caudate head is spared. A remote infarct of the left lentiform nucleus is similar. Moderate periventricular and subcortical T2 changes are present bilaterally. No other acute infarct is present. The brainstem and cerebellum are within normal limits. The internal auditory  canals are unremarkable. Flow is present in the major intracranial arteries. The globes and orbits are intact. Moderate circumferential mucosal thickening is present in the left maxillary sinus. There is mild mucosal thickening in the anterior ethmoid air cells and left frontal sinus. The right frontal sinus is hypoplastic and height opacified. A small amount of fluid is present in the mastoid air cells. No obstructing nasopharyngeal lesion is evident. The skullbase is normal. Midline sagittal images demonstrate degenerative change at C3-4. No focal intracranial lesions are evident. IMPRESSION: 1. Acute/subacute nonhemorrhagic 4 cm infarct involving the right lentiform nucleus extends superiorly into the centrum semiovale with possible involvement of the anterior limb right internal capsule. 2. Remote infarct of the left lentiform nucleus a similar location. 3. Moderate age advanced periventricular and subcortical white matter disease bilaterally likely reflects the sequela of chronic microvascular ischemia. Electronically Signed   By: Marin Roberts M.D.   On: 06/15/2015 07:11   Dg Swallowing Func-speech Pathology  06/16/2015  Objective Swallowing Evaluation:   Patient Details Name: Stiven Kaspar MRN: [id number] Date of Birth: 1945-10-29 Today's Date: 06/16/2015 Time: SLP Start Time (ACUTE ONLY): 1010-SLP Stop Time (ACUTE ONLY): 1022 SLP Time Calculation (min) (ACUTE ONLY): 12 min Past Medical History: Past Medical History Diagnosis Date . Tobacco abuse  Past Surgical History: No past surgical history on file. HPI: 70 y.o. male admitted with acute/subacute infarct within the anterior right lentiform nucleus extending into the centrum semiovale per CCT. Hx HTN, COPD exac, dyslipidemia.  Subjective: limited initiation, speech Assessment / Plan / Recommendation CHL IP CLINICAL IMPRESSIONS 06/16/2015 Therapy Diagnosis Mild oral phase dysphagia Clinical Impression Pt presents with a mild oral phase dysphagia with  decreased coordination/management of bolus material, tongue tremor observed.  Pharyngeal swallow triggered normally, with brisk clearance of all materials through pharynx, no penetration nor aspiration despite being taxed with multiple, successive sips of thin liquids.  Recommend initiating a soft diet, thin liquids, meds whole with liquids.  No SLP f/u for swallowing.    Impact on safety and function No limitations   CHL IP TREATMENT RECOMMENDATION 06/16/2015 Treatment Recommendations No treatment recommended at this time   No flowsheet data found. CHL IP DIET RECOMMENDATION 06/16/2015 SLP Diet Recommendations Dysphagia 3 (Mech soft) solids;Thin liquid Liquid Administration via Cup;Straw Medication Administration Whole meds with liquid Compensations -- Postural Changes --   CHL IP OTHER RECOMMENDATIONS 06/16/2015 Recommended Consults -- Oral Care Recommendations Oral care BID Other Recommendations --   CHL IP FOLLOW UP RECOMMENDATIONS 06/16/2015 Follow up Recommendations None   No flowsheet data found.     CHL IP ORAL PHASE 06/16/2015 Oral Phase Impaired Oral - Pudding Teaspoon -- Oral - Pudding Cup --  Oral - Honey Teaspoon -- Oral - Honey Cup -- Oral - Nectar Teaspoon -- Oral - Nectar Cup -- Oral - Nectar Straw -- Oral - Thin Teaspoon -- Oral - Thin Cup -- Oral - Thin Straw Decreased bolus cohesion Oral - Puree Decreased bolus cohesion Oral - Mech Soft Decreased bolus cohesion Oral - Regular -- Oral - Multi-Consistency -- Oral - Pill -- Oral Phase - Comment --  CHL IP PHARYNGEAL PHASE 06/16/2015 Pharyngeal Phase WFL Pharyngeal- Pudding Teaspoon -- Pharyngeal -- Pharyngeal- Pudding Cup -- Pharyngeal -- Pharyngeal- Honey Teaspoon -- Pharyngeal -- Pharyngeal- Honey Cup -- Pharyngeal -- Pharyngeal- Nectar Teaspoon -- Pharyngeal -- Pharyngeal- Nectar Cup -- Pharyngeal -- Pharyngeal- Nectar Straw -- Pharyngeal -- Pharyngeal- Thin Teaspoon -- Pharyngeal -- Pharyngeal- Thin Cup -- Pharyngeal -- Pharyngeal- Thin Straw --  Pharyngeal -- Pharyngeal- Puree -- Pharyngeal -- Pharyngeal- Mechanical Soft -- Pharyngeal -- Pharyngeal- Regular -- Pharyngeal -- Pharyngeal- Multi-consistency -- Pharyngeal -- Pharyngeal- Pill -- Pharyngeal -- Pharyngeal Comment --  No flowsheet data found. No flowsheet data found. Blenda Mounts Laurice 06/16/2015, 10:31 AM               CBC  Recent Labs Lab 06/14/15 2104  WBC 9.3  HGB 15.7  HCT 48.3  PLT 281  MCV 96.4  MCH 31.3  MCHC 32.5  RDW 13.3    Chemistries   Recent Labs Lab 06/14/15 2104 06/15/15 1329  NA 144  --   K 4.3  --   CL 104  --   CO2 29  --   GLUCOSE 149*  --   BUN 17  --   CREATININE 0.83  --   CALCIUM 10.4*  --   MG  --  1.8  AST 18  --   ALT 12*  --   ALKPHOS 76  --   BILITOT 0.6  --    ------------------------------------------------------------------------------------------------------------------ estimated creatinine clearance is 84.3 mL/min (by C-G formula based on Cr of 0.83). ------------------------------------------------------------------------------------------------------------------  Recent Labs  06/15/15 0607  HGBA1C 5.6   ------------------------------------------------------------------------------------------------------------------  Recent Labs  06/15/15 0607  CHOL 204*  HDL 37*  LDLCALC 150*  TRIG 86  CHOLHDL 5.5   ------------------------------------------------------------------------------------------------------------------ No results for input(s): TSH, T4TOTAL, T3FREE, THYROIDAB in the last 72 hours.  Invalid input(s): FREET3 ------------------------------------------------------------------------------------------------------------------ No results for input(s): VITAMINB12, FOLATE, FERRITIN, TIBC, IRON, RETICCTPCT in the last 72 hours.  Coagulation profile  Recent Labs Lab 06/14/15 2236  INR 1.05    No results for input(s): DDIMER in the last 72 hours.  Cardiac Enzymes  Recent Labs Lab  06/15/15 0159 06/15/15 0607 06/15/15 1324  TROPONINI <0.03 <0.03 <0.03   ------------------------------------------------------------------------------------------------------------------ Invalid input(s): POCBNP   Recent Labs  06/14/15 2145  GLUCAP 118*     RAI,RIPUDEEP M.D. [facility] 06/16/2015, 1:54 PM  Pager: [phone] Between 7am to 7pm - call Pager - [phone]  After 7pm go to [email or web address] - password [email or web address]  Call night coverage person covering after 7pm

## 2015-06-16 NOTE — Evaluation (Signed)
Physical Therapy Evaluation Patient Details Name: Jacob Mckenzie MRN: [id number] DOB: [birth year] Today's Date: 06/16/2015   History of Present Illness  Patient is a 70 yo male admitted 06/14/15 with AMS, aphasia.  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   PMH:  tobacco use  Clinical Impression  Patient presents with problems listed below.  Will benefit from acute PT to maximize functional independence prior to discharge home.  Patient required max encouragement to participate with PT today.  Difficulty evaluating cognition as well - patient declining to answer questions at times.  Will address f/u PT needs at next session.    Follow Up Recommendations Supervision for mobility/OOB (f/u TBD at next session)    Equipment Recommendations  None recommended by PT    Recommendations for Other Services       Precautions / Restrictions Precautions Precautions: Fall Restrictions Weight Bearing Restrictions: No      Mobility  Bed Mobility Overal bed mobility: Needs Assistance Bed Mobility: Supine to Sit;Sit to Supine     Supine to sit: Max assist Sit to supine: Min guard   General bed mobility comments: Patient lethargic initially.  Was awake, but did not want to open eyes.  Max assist to sit EOB.  Patient then opened eyes and participated with PT somewhat following that.  Good balance in sitting.  Min guard for safety to return to supine.  Transfers Overall transfer level: Needs assistance Equipment used: None Transfers: Sit to/from Stand Sit to Stand: Min assist;Min guard         General transfer comment: Min assist to stand from bed - for stability.  Patient able to stand from 3-in-1 with min guard assist.  Ambulation/Gait Ambulation/Gait assistance: Min guard Ambulation Distance (Feet): 26 Feet Assistive device: None Gait Pattern/deviations: Step-through pattern;Decreased step length - right;Decreased step length - left;Decreased stride length Gait velocity:  decreased Gait velocity interpretation: Below normal speed for age/gender General Gait Details: Initially patient stepped in place for approx 15 seconds and then returned to sitting.  Patient declined further mobility.  PT was called back to room by son - patient wanted to ambulate to bathroom.  Min guard assist for safety.  No loss of balance noted with short distance ambulation.  Stairs            Wheelchair Mobility    Modified Rankin (Stroke Patients Only) Modified Rankin (Stroke Patients Only) Pre-Morbid Rankin Score: No symptoms Modified Rankin: No significant disability     Balance                                             Pertinent Vitals/Pain Pain Assessment: No/denies pain    Home Living Family/patient expects to be discharged to:: Private residence Living Arrangements: Spouse/significant other Available Help at Discharge: Family;Available 24 hours/day (wife, daughter-in-law is CNA, other children) Type of Home: House Home Access: Stairs to enter Entrance Stairs-Rails: None Entrance Stairs-Number of Steps: 1 Home Layout: One level Home Equipment: None      Prior Function Level of Independence: Independent               Hand Dominance        Extremity/Trunk Assessment   Upper Extremity Assessment: Defer to OT evaluation (Did report Lt shoulder injury)           Lower Extremity Assessment: Overall WFL for tasks assessed (Strength  tested grossly symmetrical)      Cervical / Trunk Assessment: Normal  Communication   Communication: No difficulties (Minimal conversation.)  Cognition Arousal/Alertness: Lethargic (Difficult to arouse initially) Behavior During Therapy: Flat affect (Resistant to mobility initially) Overall Cognitive Status: Difficult to assess (Patient not answering questions. Tearful at times)                      General Comments      Exercises        Assessment/Plan    PT Assessment  Patient needs continued PT services  PT Diagnosis Abnormality of gait;Altered mental status   PT Problem List Decreased activity tolerance;Decreased mobility;Decreased balance;Decreased cognition  PT Treatment Interventions Gait training;Stair training;Functional mobility training;DME instruction;Therapeutic activities;Balance training;Cognitive remediation;Patient/family education   PT Goals (Current goals can be found in the Care Plan section) Acute Rehab PT Goals Patient Stated Goal: None stated PT Goal Formulation: With patient/family Time For Goal Achievement: 06/23/15 Potential to Achieve Goals: Good    Frequency Min 4X/week   Barriers to discharge        Co-evaluation               End of Session Equipment Utilized During Treatment: Gait belt Activity Tolerance: Patient tolerated treatment well;Patient limited by lethargy Patient left: in bed;with call bell/phone within reach;with bed alarm set;with family/visitor present Nurse Communication: Mobility status         Time: 0981-1914 and 7829-5621 PT Time Calculation (min) (ACUTE ONLY): 26 min + 19 = 45 min total   Charges:   PT Evaluation $PT Eval Moderate Complexity: 1 Procedure PT Treatments $Therapeutic Activity: 23-37 mins   PT G CodesVena Austria 24-Jun-2015, 6:43 PM Durenda Hurt. Renaldo Fiddler, [facility] Acute Rehab Services Pager [phone]

## 2015-06-16 NOTE — Progress Notes (Signed)
Speech Pathology:  MBSS complete. Full report located under chart review in imaging section.  Dakotah Orrego L. Blakelynn Scheeler, MA CCC/SLP Pager [phone]   

## 2015-06-16 NOTE — Progress Notes (Signed)
SLP Cancellation Note  Patient Details Name: Jacob Mckenzie MRN: [id number] DOB: 11/19/1945   Cancelled treatment:       Reason Eval/Treat Not Completed: Fatigue/lethargy limiting ability to participate.  Pt unable to stay awake to participate in speech/language evaluation.  Educated dtr re: results of MBS.  Will follow.   Blenda Mounts Laurice 06/16/2015, 1:58 PM

## 2015-06-16 NOTE — Progress Notes (Signed)
Utilization Review Completed.Jacob Mckenzie T1/20/2017  

## 2015-06-17 DIAGNOSIS — R030 Elevated blood-pressure reading, without diagnosis of hypertension: Secondary | ICD-10-CM

## 2015-06-17 DIAGNOSIS — J441 Chronic obstructive pulmonary disease with (acute) exacerbation: Secondary | ICD-10-CM

## 2015-06-17 LAB — BASIC METABOLIC PANEL
ANION GAP: 7 (ref 5–15)
BUN: 11 mg/dL (ref 6–20)
CHLORIDE: 109 mmol/L (ref 101–111)
CO2: 26 mmol/L (ref 22–32)
Calcium: 9 mg/dL (ref 8.9–10.3)
Creatinine, Ser: 0.63 mg/dL (ref 0.61–1.24)
GFR calc Af Amer: >60 mL/min (ref >60)
GLUCOSE: 104 mg/dL — AB (ref 65–99)
POTASSIUM: 4.3 mmol/L (ref 3.5–5.1)
Sodium: 142 mmol/L (ref 135–145)

## 2015-06-17 LAB — CBC
HEMATOCRIT: 39.2 % (ref 39.0–52.0)
HEMOGLOBIN: 12.9 g/dL — AB (ref 13.0–17.0)
MCH: 31.1 pg (ref 26.0–34.0)
MCHC: 32.9 g/dL (ref 30.0–36.0)
MCV: 94.5 fL (ref 78.0–100.0)
PLATELETS: 233 10*3/uL (ref 150–400)
RBC: 4.15 MIL/uL — AB (ref 4.22–5.81)
RDW: 13.5 % (ref 11.5–15.5)
WBC: 12.7 10*3/uL — AB (ref 4.0–10.5)

## 2015-06-17 MED ORDER — AZITHROMYCIN 500 MG PO TABS: 500.0000 mg | ORAL_TABLET | Freq: Every day | ORAL | Status: DC

## 2015-06-17 MED ORDER — PREDNISONE 10 MG PO TABS: ORAL_TABLET | ORAL | Status: DC

## 2015-06-17 MED ORDER — CLOPIDOGREL BISULFATE 75 MG PO TABS: 75.0000 mg | ORAL_TABLET | Freq: Every day | ORAL | Status: AC

## 2015-06-17 MED ORDER — CEFUROXIME AXETIL 500 MG PO TABS
500.0000 mg | ORAL_TABLET | Freq: Two times a day (BID) | ORAL | Status: DC
  Administered 2015-06-17: 500 mg via ORAL
  Filled 2015-06-17: qty 1

## 2015-06-17 MED ORDER — ATORVASTATIN CALCIUM 40 MG PO TABS: 40.0000 mg | ORAL_TABLET | Freq: Every day | ORAL | Status: AC

## 2015-06-17 MED ORDER — ALBUTEROL SULFATE HFA 108 (90 BASE) MCG/ACT IN AERS: 1.0000 | INHALATION_SPRAY | RESPIRATORY_TRACT | Status: AC | PRN

## 2015-06-17 MED ORDER — PREDNISONE 20 MG PO TABS
60.0000 mg | ORAL_TABLET | Freq: Every day | ORAL | Status: DC
  Administered 2015-06-17: 60 mg via ORAL
  Filled 2015-06-17: qty 3

## 2015-06-17 MED ORDER — CEFUROXIME AXETIL 500 MG PO TABS: 500.0000 mg | ORAL_TABLET | Freq: Two times a day (BID) | ORAL | Status: DC

## 2015-06-17 MED ORDER — ALBUTEROL SULFATE (2.5 MG/3ML) 0.083% IN NEBU
2.5000 mg | INHALATION_SOLUTION | RESPIRATORY_TRACT | Status: DC
  Administered 2015-06-17: 2.5 mg via RESPIRATORY_TRACT
  Filled 2015-06-17 (×3): qty 3

## 2015-06-17 MED ORDER — AZITHROMYCIN 500 MG PO TABS
500.0000 mg | ORAL_TABLET | Freq: Every day | ORAL | Status: DC
  Filled 2015-06-17: qty 1

## 2015-06-17 MED ORDER — METOPROLOL TARTRATE 25 MG PO TABS: 25.0000 mg | ORAL_TABLET | Freq: Two times a day (BID) | ORAL | Status: AC

## 2015-06-17 MED ORDER — NICOTINE 21 MG/24HR TD PT24: 21.0000 mg | MEDICATED_PATCH | Freq: Every day | TRANSDERMAL | Status: DC

## 2015-06-17 MED ORDER — DM-GUAIFENESIN ER 30-600 MG PO TB12: 1.0000 | ORAL_TABLET | Freq: Two times a day (BID) | ORAL | Status: DC

## 2015-06-17 NOTE — Progress Notes (Signed)
Physical Therapy Treatment Patient Details Name: Jacob Mckenzie MRN: [id number] DOB: 05/26/1946 Today's Date: 06/17/2015    History of Present Illness Patient is a 70 yo male admitted 06/14/15 with AMS, aphasia.  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   PMH:  tobacco use    PT Comments    Patient much more alert and participative today.  Able to ambulate 120' with no assistive device with no loss of balance.  Patient reports he is at his baseline functional level.  No f/u PT needs identified.  Patient ready for d/c from PT perspective.   Follow Up Recommendations  No PT follow up;Supervision for mobility/OOB     Equipment Recommendations  None recommended by PT    Recommendations for Other Services       Precautions / Restrictions Precautions Precautions: None Restrictions Weight Bearing Restrictions: No    Mobility  Bed Mobility Overal bed mobility: Modified Independent Bed Mobility: Supine to Sit;Sit to Supine     Supine to sit: Modified independent (Device/Increase time) Sit to supine: Modified independent (Device/Increase time)   General bed mobility comments: Increased time.  No physical assist needed  Transfers Overall transfer level: Modified independent Equipment used: None Transfers: Sit to/from Stand Sit to Stand: Modified independent (Device/Increase time)         General transfer comment: Increased time.  Instructed patient to stand for several seconds before beginning to ambulate for safety.  Ambulation/Gait Ambulation/Gait assistance: Supervision Ambulation Distance (Feet): 120 Feet Assistive device: None Gait Pattern/deviations: Step-through pattern;Decreased stride length   Gait velocity interpretation: Below normal speed for age/gender General Gait Details: Patient with good gait pattern.  No loss of balance during gait with no assistive device.   Stairs            Wheelchair Mobility    Modified Rankin (Stroke Patients  Only) Modified Rankin (Stroke Patients Only) Pre-Morbid Rankin Score: No symptoms Modified Rankin: No significant disability     Balance Overall balance assessment: Needs assistance         Standing balance support: No upper extremity supported Standing balance-Leahy Scale: Good                      Cognition Arousal/Alertness: Awake/alert Behavior During Therapy: WFL for tasks assessed/performed Overall Cognitive Status: Within Functional Limits for tasks assessed                      Exercises      General Comments        Pertinent Vitals/Pain Pain Assessment: No/denies pain    Home Living                      Prior Function            PT Goals (current goals can now be found in the care plan section) Progress towards PT goals: Goals met/education completed, patient discharged from PT    Frequency  Min 4X/week    PT Plan Discharge plan needs to be updated    Co-evaluation             End of Session   Activity Tolerance: Patient tolerated treatment well Patient left: in bed;with call bell/phone within reach;with bed alarm set     Time: 0832-0843 PT Time Calculation (min) (ACUTE ONLY): 11 min  Charges:  $Gait Training: 8-22 mins                      G Codes:      Davis, Susan H 06/17/2015, 8:49 AM Susan H. Davis, PT, MBA Acute Rehab Services Pager [phone]   

## 2015-06-17 NOTE — Progress Notes (Signed)
The stroke team will sign off at this time. Please call if we can be of further service. Follow-up with Dr. Pearlean Brownie in 2 months.  Delton See PA-C [facility] Pager [phone] 06/17/2015, 10:26 AM

## 2015-06-17 NOTE — Progress Notes (Signed)
RN discussed discharge instructions with patient and family. Patient's family vocalized understanding of discharge instructions including heart healthy diet, follow up appointment with Dr. Pearlean Brownie per order, medications. Patient's family vocalized understanding of directions. Discharge instructions and prescriptions were given to patient's wife. Patient denies any pain, NIHHS remains at 0, neuro assessment unchanged. Tele discontinued, IV removed. Patient escorted via wheelchair to wife's car with nurse tech, Mimi.

## 2015-06-17 NOTE — Discharge Summary (Signed)
Physician Discharge Summary   Patient ID: Jacob Mckenzie MRN: [id number] DOB/AGE: 1945-09-16 70 y.o.  Admit date: 06/14/2015 Discharge date: 06/17/2015  Primary Care Physician:  PROVIDER NOT IN SYSTEM  Discharge Diagnoses:   . Acute CVA (cerebrovascular accident) (HCC) . Acute encephalopathy, back to baseline, resolved  . Tobacco abuse . COPD (chronic obstructive pulmonary disease) (HCC) exacerbation  . Abnormal EKG . Hypercalcemia   Consults: Neurology  Recommendations for Outpatient Follow-up:  1. PT evaluation recommended no PT follow-up 2. Please repeat CBC/BMET at next visit 3.   DIET: Soft diet    Allergies:  No Known Allergies   DISCHARGE MEDICATIONS: Current Discharge Medication List    START taking these medications   Details  albuterol (PROVENTIL HFA;VENTOLIN HFA) 108 (90 Base) MCG/ACT inhaler Inhale 1-2 puffs into the lungs every 4 (four) hours as needed for wheezing or shortness of breath (ICD10code  J44.9). Qty: 1 Inhaler, Refills: 5    atorvastatin (LIPITOR) 40 MG tablet Take 1 tablet (40 mg total) by mouth at bedtime. Qty: 30 tablet, Refills: 4    azithromycin (ZITHROMAX) 500 MG tablet Take 1 tablet (500 mg total) by mouth daily. X 7days Qty: 7 tablet, Refills: 0    cefUROXime (CEFTIN) 500 MG tablet Take 1 tablet (500 mg total) by mouth 2 (two) times daily with a meal. X 7days Qty: 14 tablet, Refills: 0    clopidogrel (PLAVIX) 75 MG tablet Take 1 tablet (75 mg total) by mouth daily. Qty: 30 tablet, Refills: 5    dextromethorphan-guaiFENesin (MUCINEX DM) 30-600 MG 12hr tablet Take 1 tablet by mouth 2 (two) times daily. Qty: 30 tablet, Refills: 0    metoprolol tartrate (LOPRESSOR) 25 MG tablet Take 1 tablet (25 mg total) by mouth 2 (two) times daily. Qty: 60 tablet, Refills: 3    nicotine (NICODERM CQ - DOSED IN MG/24 HOURS) 21 mg/24hr patch Place 1 patch (21 mg total) onto the skin daily. Qty: 28 patch, Refills: 3    predniSONE (DELTASONE) 10  MG tablet Prednisone dosing: Take  Prednisone 40mg  (4 tabs) x 3 days, then taper to 30mg  (3 tabs) x 3 days, then 20mg  (2 tabs) x 3days, then 10mg  (1 tab) x 3days, then OFF.  Dispense:  30 tabs, refills: None Qty: 30 tablet, Refills: 0      CONTINUE these medications which have NOT CHANGED   Details  Multiple Vitamins-Minerals (MULTIVITAMIN & MINERAL PO) Take 1 tablet by mouth daily.      STOP taking these medications     aspirin EC 81 MG tablet          Brief H and P: For complete details please refer to admission H and P, but in brief Williard Keller is a 70 y.o. male with PMH of tobacco abuse, who presents with altered mental status and a cough. Per patient's wife, he has been confused in the past 3 days. No unilateral weakness, numbness or tingling sensations. He has mild intermittent headache, but no vision change or hearing loss. No neck stiffness. Patient does not have fever or chills. Patient's wife think patient may have UTI, since she noticed possible blood in urine. Patient has a cough with yellow colored sputum production. No chest pain, shortness of breath, fever or chills. Patient does not have abdominal pain, diarrhea. Per patient's wife, patient has smoked 55 years, and quit smoking 2 days ago. In ED, patient was found to have calcium 10.4, lactate 0.78, INR1.05, troponin negative, ammonium level 25, BNP 175, negative UDS,  negative urinalysis, WBC 9.3, temperature normal, no tachycardia, electrolytes and renal function okay, negative chest x-ray, negative CT-head for acute abnormalities. ABG showed pH 7.433, PCO2 25 and PO2 71. Patient is admitted to inpatient for further eval and treatment. Neurology was consulted by EDP. EKG: Independently reviewed. QTC 424, ventricular bigeminy.   Hospital Course:  Acute CVA- presenting with acute encephalopathy - Delayed presentation hence not a TPA candidate. Neurology was consulted. - MRI brain showed acute/subacute nonhemorrhagic 4 cm  infarct involving the right lentiform nucleus extending superiorly into the centrum semiovale with possible involvement of the anterior limb right internal capsule, remote infarct of the left lentiform nucleus - CTA head and neck showed acute/subacute infarct in the anterior right lentiform nucleus extending to the Centrum semi-ovale, moderate atrophy and white matter disease, atherosclerotic changes in the proximal basilar artery with mild narrowing less than 50%, moderate medium, distal small vessel disease bilaterally. - 2-D echo EF 50-55%, normal wall motion - Carotid Dopplers canceled due to CT angiogram of the head and neck - Neurology recommended Plavix 75 mg daily - Lipid panel showed LDL 150, goal less than 70, placed on statin - Hemoglobin A1c 5.6 - PT evaluation recommended no PT follow-up  Possible COPD and COPD exacerbation: Patient has smoked for 55 years, now presented with productive cough and wheezing on auscultation, indicating possible COPD and mild COPD exacerbation. - Patient was placed on scheduled nebs, IV Solu-Medrol, IV Zithromax. Blood cultures remain negative to date - Patient was transitioned to oral prednisone with taper, oral Zithromax and Ceftin.  Hypercalcemia: Calcium 10.4, likely due to dehydration.  -Patient received IV fluids, repeat calcium 9.0, improved  Elevated blood pressure:  - BP elevated, permissive hypertension due to acute stroke, for now increased metoprolol   Day of Discharge BP 149/52 mmHg  Pulse 47  Temp(Src) 98 F (36.7 C) (Oral)  Resp 20  Ht  (1.803 m)  Wt 73.029 kg (161 lb)  BMI 22.46 kg/m2  SpO2 90%  Physical Exam: General: Alert and awake oriented x3 not in any acute distress. HEENT: anicteric sclera, pupils reactive to light and accommodation CVS: S1-S2 clear no murmur rubs or gallops Chest: Mild scattered wheezing. Abdomen: soft nontender, nondistended, normal bowel sounds Extremities: no cyanosis, clubbing or edema  noted bilaterally Neuro: Cranial nerves II-XII intact, no focal neurological deficits   The results of significant diagnostics from this hospitalization (including imaging, microbiology, ancillary and laboratory) are listed below for reference.    LAB RESULTS: Basic Metabolic Panel:  Recent Labs Lab 06/15/15 1329 06/16/15 1900 06/17/15 0237  NA  --  140 142  K  --  4.3 4.3  CL  --  108 109  CO2  --  24 26  GLUCOSE  --  153* 104*  BUN  --  10 11  CREATININE  --  0.76 0.63  CALCIUM  --  9.4 9.0  MG 1.8  --   --    Liver Function Tests:  Recent Labs Lab 06/14/15 2104 06/16/15 1900  AST 18 30  ALT 12* 13*  ALKPHOS 76 64  BILITOT 0.6 0.3  PROT 8.0 6.4*  ALBUMIN 4.6 3.4*   No results for input(s): LIPASE, AMYLASE in the last 168 hours.  Recent Labs Lab 06/15/15 0128  AMMONIA 25   CBC:  Recent Labs Lab 06/14/15 2104 06/17/15 0237  WBC 9.3 12.7*  HGB 15.7 12.9*  HCT 48.3 39.2  MCV 96.4 94.5  PLT 281 233   Cardiac Enzymes:  Recent Labs Lab 06/15/15 0607 06/15/15 1324  TROPONINI <0.03 <0.03   BNP: Invalid input(s): POCBNP CBG:  Recent Labs Lab 06/14/15 2145  GLUCAP 118*    Significant Diagnostic Studies:  Ct Angio Head W/cm &/or Wo Cm  06/15/2015  CLINICAL DATA:  Right basal ganglia infarct. EXAM: CT ANGIOGRAPHY HEAD AND NECK TECHNIQUE: Multidetector CT imaging of the head and neck was performed using the standard protocol during bolus administration of intravenous contrast. Multiplanar CT image reconstructions and MIPs were obtained to evaluate the vascular anatomy. Carotid stenosis measurements (when applicable) are obtained utilizing NASCET criteria, using the distal internal carotid diameter as the denominator. CONTRAST:  75mL OMNIPAQUE IOHEXOL 350 MG/ML SOLN COMPARISON:  MRI brain from the same day. FINDINGS: CT HEAD Brain: The acute/ subacute anterior lentiform nucleus infarct extending superiorly in in the centrum semiovale is again noted  without significant interval change. A similar remote infarct of the left lentiform nucleus is again noted. Moderate periventricular white matter changes are present. The source images demonstrate no other acute infarct. Calvarium and skull base: Intact Paranasal sinuses: Circumferential mucosal disease is present in the left maxillary sinus. Mild anterior ethmoid and right frontal sinus disease is present. The mastoid air cells are clear. Orbits: Within normal limits CTA NECK Aortic arch: A 4 vessel arch configuration is present. The left vertebral artery originates from the aortic arch. There is no significant stenosis from the origins of the great vessels. Right carotid system: The right common carotid artery is within normal limits. Atherosclerotic changes are present at the carotid bifurcation and distal right common carotid artery without a significant stenosis relative to the more distal vessel. There is focal tortuosity in the proximal right internal carotid artery without a significant stenosis. Left carotid system: The left common carotid artery is within normal limits. Dilation through the bifurcation suggests a previous endarterectomy. There is no residual or recurrent stenosis. The distal cervical left ICA is within normal limits. Vertebral arteries:The vertebral arteries enter into the spinal canal at C4-5 bilaterally, a normal variant. The right vertebral artery is the dominant vessel. There are no tandem stenoses in the neck. Skeleton: Prominent posterior endplate osteophytes and uncovertebral spurring is present at C3-4. Degenerative anterolisthesis present at C5-6 with chronic loss of height at C5-6, C6-7, and C7-T1. There is chronic loss of disc height throughout the upper thoracic spine. Patient is edentulous. The mandible is intact and located. No focal lytic or blastic lesions are present. Other neck: The soft tissues of the neck are unremarkable. The salivary glands are within normal limits.  No focal mucosal or submucosal lesions are present. There is no significant adenopathy. The thyroid is within normal limits. The lung apices are clear. CTA HEAD Anterior circulation: Atherosclerotic changes are present within the cavernous internal carotid arteries bilaterally without a significant stenosis. The ICA termini are within normal limits bilaterally. The A1 and M1 segments are within normal limits. The anterior communicating artery is patent. The MCA bifurcations are intact. Moderate mid and distal MCA branch vessel stenoses are present bilaterally without a significant proximal stenosis or occlusion. Posterior circulation: The right vertebral artery is the dominant vessel. The PICA origins are not visualized. Prominent AICA vessels are present. Atherosclerotic changes are present in the basilar artery without a significant stenosis. The posterior cerebral arteries originate from the basilar tip. Posterior communicating arteries are present bilaterally. There is moderate attenuation of distal stool PCA branch vessels bilaterally. Venous sinuses: Flow is present in the dural sinuses. The right transverse  sinus is dominant. The straight sinus and deep cerebral veins are patent. The cortical veins are intact. Anatomic variants: Posterior communicating arteries are present bilaterally. Delayed phase: The postcontrast images demonstrate no pathologic enhancement. IMPRESSION: 1. Acute/subacute infarct within the anterior right lentiform nucleus extending into the centrum semiovale. 2. Remote infarct of the left lentiform nucleus. 3. Moderate atrophy and white matter disease. 4. 4 vessel arch configuration. 5. Atherosclerotic changes at the right carotid bifurcation without significant stenosis. 6. Changes of the left carotid bifurcation suggesting previous endarterectomy. There is no residual or recurrent stenosis. 7. Atherosclerotic changes within the proximal basilar artery with mild narrowing of less than  50%. 8. Moderate medium and distal small vessel disease bilaterally without a significant proximal stenosis or occlusion within the branch vessels. 9. Moderate spondylosis in the cervical spine. Electronically Signed   By: Marin Roberts M.D.   On: 06/15/2015 11:58   Dg Chest 2 View  06/14/2015  CLINICAL DATA:  70 year old male with cough EXAM: CHEST  2 VIEW COMPARISON:  None. FINDINGS: The heart size and mediastinal contours are within normal limits. Both lungs are clear. The visualized skeletal structures are unremarkable. IMPRESSION: No active cardiopulmonary disease. Electronically Signed   By: Elgie Collard M.D.   On: 06/14/2015 23:17   Ct Head Wo Contrast  06/14/2015  CLINICAL DATA:  70 year old male with altered mental status EXAM: CT HEAD WITHOUT CONTRAST TECHNIQUE: Contiguous axial images were obtained from the base of the skull through the vertex without intravenous contrast. COMPARISON:  None. FINDINGS: The ventricles are dilated and the sulci are prominent compatible with age-related atrophy. Periventricular and deep white matter hypodensities represent chronic microvascular ischemic changes. Bilateral basal ganglia hypodensities compatible with old infarcts. There is no intracranial hemorrhage. No mass effect or midline shift identified. In there is mild mucoperiosteal thickening of the paranasal sinuses. The mastoid air cells are well aerated. Calvarium is intact. IMPRESSION: No acute intracranial hemorrhage. Age-related atrophy and chronic microvascular ischemic disease. If symptoms persist and there are no contraindications, MRI may provide better evaluation if clinically indicated. Electronically Signed   By: Elgie Collard M.D.   On: 06/14/2015 23:21   Ct Angio Neck W/cm &/or Wo/cm  06/15/2015  CLINICAL DATA:  Right basal ganglia infarct. EXAM: CT ANGIOGRAPHY HEAD AND NECK TECHNIQUE: Multidetector CT imaging of the head and neck was performed using the standard protocol during  bolus administration of intravenous contrast. Multiplanar CT image reconstructions and MIPs were obtained to evaluate the vascular anatomy. Carotid stenosis measurements (when applicable) are obtained utilizing NASCET criteria, using the distal internal carotid diameter as the denominator. CONTRAST:  75mL OMNIPAQUE IOHEXOL 350 MG/ML SOLN COMPARISON:  MRI brain from the same day. FINDINGS: CT HEAD Brain: The acute/ subacute anterior lentiform nucleus infarct extending superiorly in in the centrum semiovale is again noted without significant interval change. A similar remote infarct of the left lentiform nucleus is again noted. Moderate periventricular white matter changes are present. The source images demonstrate no other acute infarct. Calvarium and skull base: Intact Paranasal sinuses: Circumferential mucosal disease is present in the left maxillary sinus. Mild anterior ethmoid and right frontal sinus disease is present. The mastoid air cells are clear. Orbits: Within normal limits CTA NECK Aortic arch: A 4 vessel arch configuration is present. The left vertebral artery originates from the aortic arch. There is no significant stenosis from the origins of the great vessels. Right carotid system: The right common carotid artery is within normal limits. Atherosclerotic changes are present  at the carotid bifurcation and distal right common carotid artery without a significant stenosis relative to the more distal vessel. There is focal tortuosity in the proximal right internal carotid artery without a significant stenosis. Left carotid system: The left common carotid artery is within normal limits. Dilation through the bifurcation suggests a previous endarterectomy. There is no residual or recurrent stenosis. The distal cervical left ICA is within normal limits. Vertebral arteries:The vertebral arteries enter into the spinal canal at C4-5 bilaterally, a normal variant. The right vertebral artery is the dominant vessel.  There are no tandem stenoses in the neck. Skeleton: Prominent posterior endplate osteophytes and uncovertebral spurring is present at C3-4. Degenerative anterolisthesis present at C5-6 with chronic loss of height at C5-6, C6-7, and C7-T1. There is chronic loss of disc height throughout the upper thoracic spine. Patient is edentulous. The mandible is intact and located. No focal lytic or blastic lesions are present. Other neck: The soft tissues of the neck are unremarkable. The salivary glands are within normal limits. No focal mucosal or submucosal lesions are present. There is no significant adenopathy. The thyroid is within normal limits. The lung apices are clear. CTA HEAD Anterior circulation: Atherosclerotic changes are present within the cavernous internal carotid arteries bilaterally without a significant stenosis. The ICA termini are within normal limits bilaterally. The A1 and M1 segments are within normal limits. The anterior communicating artery is patent. The MCA bifurcations are intact. Moderate mid and distal MCA branch vessel stenoses are present bilaterally without a significant proximal stenosis or occlusion. Posterior circulation: The right vertebral artery is the dominant vessel. The PICA origins are not visualized. Prominent AICA vessels are present. Atherosclerotic changes are present in the basilar artery without a significant stenosis. The posterior cerebral arteries originate from the basilar tip. Posterior communicating arteries are present bilaterally. There is moderate attenuation of distal stool PCA branch vessels bilaterally. Venous sinuses: Flow is present in the dural sinuses. The right transverse sinus is dominant. The straight sinus and deep cerebral veins are patent. The cortical veins are intact. Anatomic variants: Posterior communicating arteries are present bilaterally. Delayed phase: The postcontrast images demonstrate no pathologic enhancement. IMPRESSION: 1. Acute/subacute  infarct within the anterior right lentiform nucleus extending into the centrum semiovale. 2. Remote infarct of the left lentiform nucleus. 3. Moderate atrophy and white matter disease. 4. 4 vessel arch configuration. 5. Atherosclerotic changes at the right carotid bifurcation without significant stenosis. 6. Changes of the left carotid bifurcation suggesting previous endarterectomy. There is no residual or recurrent stenosis. 7. Atherosclerotic changes within the proximal basilar artery with mild narrowing of less than 50%. 8. Moderate medium and distal small vessel disease bilaterally without a significant proximal stenosis or occlusion within the branch vessels. 9. Moderate spondylosis in the cervical spine. Electronically Signed   By: Marin Roberts M.D.   On: 06/15/2015 11:58   Mr Brain Wo Contrast (neuro Protocol)  06/15/2015  CLINICAL DATA:  Confusion over the last 3 days. Generalized weakness. Mental status change. EXAM: MRI HEAD WITHOUT CONTRAST TECHNIQUE: Multiplanar, multiecho pulse sequences of the brain and surrounding structures were obtained without intravenous contrast. COMPARISON:  CT head without contrast 06/14/2015. FINDINGS: The diffusion-weighted images demonstrate an acute nonhemorrhagic infarct involving the right lentiform nucleus and extending superiorly within the centrum semiovale. There is probable involvement of the anterior limb of the right internal capsule. The right caudate head is spared. A remote infarct of the left lentiform nucleus is similar. Moderate periventricular and subcortical T2 changes are  present bilaterally. No other acute infarct is present. The brainstem and cerebellum are within normal limits. The internal auditory canals are unremarkable. Flow is present in the major intracranial arteries. The globes and orbits are intact. Moderate circumferential mucosal thickening is present in the left maxillary sinus. There is mild mucosal thickening in the anterior  ethmoid air cells and left frontal sinus. The right frontal sinus is hypoplastic and height opacified. A small amount of fluid is present in the mastoid air cells. No obstructing nasopharyngeal lesion is evident. The skullbase is normal. Midline sagittal images demonstrate degenerative change at C3-4. No focal intracranial lesions are evident. IMPRESSION: 1. Acute/subacute nonhemorrhagic 4 cm infarct involving the right lentiform nucleus extends superiorly into the centrum semiovale with possible involvement of the anterior limb right internal capsule. 2. Remote infarct of the left lentiform nucleus a similar location. 3. Moderate age advanced periventricular and subcortical white matter disease bilaterally likely reflects the sequela of chronic microvascular ischemia. Electronically Signed   By: Marin Roberts M.D.   On: 06/15/2015 07:11    2D ECHO: Study Conclusions  - Left ventricle: The cavity size was normal. Wall thickness was increased in a pattern of mild LVH. Systolic function was normal. The estimated ejection fraction was in the range of 50% to 55%. Wall motion was normal; there were no regional wall motion abnormalities. The study is not technically sufficient to allow evaluation of LV diastolic function.  Impressions:  - Low normal LV systolic function; mild LVH; no significant valvular abnormality.  Disposition and Follow-up: Discharge Instructions    Ambulatory referral to Neurology    Complete by:  As directed   Dr. Pearlean Brownie requests follow up for this patient in 2 months.     Diet - low sodium heart healthy    Complete by:  As directed      Increase activity slowly    Complete by:  As directed             DISPOSITION: HOME    DISCHARGE FOLLOW-UP Follow-up Information    Follow up with SETHI,PRAMOD, MD. Schedule an appointment as soon as possible for a visit in 2 months.   Specialties:  Neurology, Radiology   Why:  for hospital follow-up/ stroke     Contact information:   [address] [place] [address] [phone]       Schedule an appointment as soon as possible for a visit in 2 weeks to follow up.   Why:  for hospital follow-up   Contact information:   [facility]/primary care physician       Time spent on Discharge:   Signed:   Renuka Farfan M.D. [facility] 06/17/2015, 12:00 PM Pager: [phone]

## 2015-06-17 NOTE — Discharge Instructions (Signed)

## 2015-06-18 LAB — CULTURE, RESPIRATORY: GRAM STAIN: NONE SEEN

## 2015-06-18 LAB — CULTURE, RESPIRATORY W GRAM STAIN: Culture: NORMAL

## 2015-06-19 LAB — CULTURE, BLOOD (ROUTINE X 2): Culture: NO GROWTH

## 2015-06-20 LAB — CULTURE, BLOOD (ROUTINE X 2): Culture: NO GROWTH

## 2015-06-30 DIAGNOSIS — E785 Hyperlipidemia, unspecified: Secondary | ICD-10-CM | POA: Diagnosis not present

## 2015-06-30 DIAGNOSIS — I639 Cerebral infarction, unspecified: Secondary | ICD-10-CM | POA: Diagnosis not present

## 2015-06-30 DIAGNOSIS — I10 Essential (primary) hypertension: Secondary | ICD-10-CM | POA: Diagnosis not present

## 2015-06-30 DIAGNOSIS — F17200 Nicotine dependence, unspecified, uncomplicated: Secondary | ICD-10-CM | POA: Diagnosis not present

## 2015-08-21 ENCOUNTER — Ambulatory Visit (INDEPENDENT_AMBULATORY_CARE_PROVIDER_SITE_OTHER): Payer: Medicare Other | Admitting: Neurology

## 2015-08-21 ENCOUNTER — Encounter: Payer: Self-pay | Admitting: Neurology

## 2015-08-21 VITALS — BP 101/69 | HR 50 | Ht 71.0 in | Wt 163.0 lb

## 2015-08-21 DIAGNOSIS — I63131 Cerebral infarction due to embolism of right carotid artery: Secondary | ICD-10-CM | POA: Diagnosis not present

## 2015-08-21 DIAGNOSIS — I639 Cerebral infarction, unspecified: Secondary | ICD-10-CM | POA: Diagnosis not present

## 2015-08-21 NOTE — Progress Notes (Signed)
[facility] [address] [place]. [place] [address] [phone]       OFFICE FOLLOW-UP NOTE  [name] Date of Birth:  1945-11-30 Medical Record Number:  [id number]   HPI: 70 year male seen for first office f/u visit after [facility] admission in January 2017 for stroke.Jacob Mckenzie is a 70 y.o. male with a history of heavy tobacco abuse who presents with several days of altered mental status. He complained of being lightheaded on Sunday, but did not have any confusion. On Monday, his wife began noticing that he would do things that were bizarre. He would say things which were not pertinent to the situation at hand or were completely nonsensical in current context. This has been persistent and therefore yesterday, his wife took him into an urgent care who referred him in to the emergency department. LKW: Monday tpa given?: no, out of window Premorbid modified rankin scale:   CT scan of the head on admission shows acute to subacute right anterior lentiform nucleus infarct extending into the centrum semiovale and remote age left basal ganglia infarct and moderate atrophy and white matter changes no acute abnormality. CT angiogram showed no significant extracranial carotid disease and no large vessel proximal stenosis intracranially as well. I have personally viewed imaging studies an MRI confirmed a large right subcortical infarct. LDL cholesterol was elevated at 150 and hemoglobin A1c was 5.6. Transthoraxic   echo showed normal ejection fraction without cardiac source of embolism. Patient wasn't on aspirin and this was changed to Plavix for secondary stroke prevention and consult to quit smoking but he states he has done. He isn't good on branch. Is also tolerating Lipitor well without muscle aches pains or other side effects. He is on Plavix and having minor bruising but no bleeding. The patient's wife states that he still gets confused occasionally but not as much. Gradually  getting better. His wife at times is softer. He continues to have mild balance difficulties but this is not constant. He has quit smoking as well as cut back caffeine intake. His blood pressure is well controlled and today it is 10169. I discussed perspiration and respect cases trial but patient is not interested.   ROS:   14 system review of systems is positive for activity change, fatigue, cough, wheezing, shortness of breath, constipation, snoring, daytime sleepiness, joint pain, neck stiffness, bruising, dizziness, headache, weakness, agitation, confusion, decreased concentration and all other systems negative  PMH:  Past Medical History  Diagnosis Date  . Tobacco abuse   . Stroke (HCC)   . COPD (chronic obstructive pulmonary disease) (HCC)     Social History:  Social History   Social History  . Marital Status: Married    Spouse Name: N/A  . Number of Children: N/A  . Years of Education: N/A   Occupational History  . Not on file.   Social History Main Topics  . Smoking status: Former Smoker -- 55 years    Quit date: 06/13/2015  . Smokeless tobacco: Never Used  . Alcohol Use: 1.2 oz/week    1 Cans of beer, 1 Shots of liquor, 0 Standard drinks or equivalent per week     Comment: occasionally  . Drug Use: No  . Sexual Activity: No   Other Topics Concern  . Not on file   Social History Narrative    Medications:   Current Outpatient Prescriptions on File Prior to Visit  Medication Sig Dispense Refill  . albuterol (PROVENTIL HFA;VENTOLIN HFA) 108 (90  Base) MCG/ACT inhaler Inhale 1-2 puffs into the lungs every 4 (four) hours as needed for wheezing or shortness of breath (ICD10code  J44.9). 1 Inhaler 5  . atorvastatin (LIPITOR) 40 MG tablet Take 1 tablet (40 mg total) by mouth at bedtime. 30 tablet 4  . clopidogrel (PLAVIX) 75 MG tablet Take 1 tablet (75 mg total) by mouth daily. 30 tablet 5  . metoprolol tartrate (LOPRESSOR) 25 MG tablet Take 1 tablet (25 mg total) by  mouth 2 (two) times daily. 60 tablet 3  . Multiple Vitamins-Minerals (MULTIVITAMIN & MINERAL PO) Take 1 tablet by mouth daily.     No current facility-administered medications on file prior to visit.    Allergies:  No Known Allergies  Physical Exam General: well developed, well nourished, seated, in no evident distress Head: head normocephalic and atraumatic.  Neck: supple with no carotid or supraclavicular bruits Cardiovascular: regular rate and rhythm, no murmurs Musculoskeletal: no deformity Skin:  no rash/petichiae Vascular:  Normal pulses all extremities Filed Vitals:   08/21/15 1201  BP: 101/69  Pulse: 50   Neurologic Exam Mental Status: Awake and fully alert. Oriented to place and time. Recent and remote memory intact. Attention span, concentration and fund of knowledge appropriate. Mood and affect appropriate.  Cranial Nerves: Fundoscopic exam reveals sharp disc margins. Pupils equal, briskly reactive to light. Extraocular movements full without nystagmus. Visual fields full to confrontation. Hearing intact. Facial sensation intact. Face, tongue, palate moves normally and symmetrically.  Motor: Normal bulk and tone. Normal strength in all tested extremity muscles. Diminished fine finger movements on the left. Minimum weakness of left grip. Orbits right over left approximately. Sensory.: intact to touch ,pinprick .position and vibratory sensation.  Coordination: Rapid alternating movements normal in all extremities. Finger-to-nose and heel-to-shin performed accurately bilaterally. Gait and Station: Arises from chair without difficulty. Stance is normal. Gait demonstrates normal stride length and balance . Able to heel, toe and tandem walk with mild difficulty.  Reflexes: 1+ and symmetric. Toes downgoing.   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 I complimented him on quitting smoking. I also advised the patient to eat a healthy diet with plenty of whole grains, cereals, fruits and vegetables, exercise regularly and maintain ideal body weight Followup in the future with stroke nurse practitioner in 6 months or call earlier if necessary Greater than 50% of time during this 25 minute visit was spent on counseling,explanation of diagnosis, planning of further management, discussion with patient and family and coordination of care Delia HeadyPramod Sethi, MD Note: This document was prepared with digital dictation and possible smart phrase technology. Any transcriptional errors that result from this process are unintentional

## 2015-08-21 NOTE — Patient Instructions (Signed)
I had a long d/w patient and his wife about his recent stroke, risk for recurrent stroke/TIAs, personally independently reviewed imaging studies and stroke evaluation results and answered questions.Continue Plavix  for secondary stroke prevention and maintain strict control of hypertension with blood pressure goal below 130/90, diabetes with hemoglobin A1c goal below 6.5% and lipids with LDL cholesterol goal below 70 mg/dL. I also advised the patient to eat a healthy diet with plenty of whole grains, cereals, fruits and vegetables, exercise regularly and maintain ideal body weight Followup in the future with stroke nurse practitioner in 6 months or call earlier if necessary Stroke Prevention Some medical conditions and behaviors are associated with an increased chance of having a stroke. You may prevent a stroke by making healthy choices and managing medical conditions. HOW CAN I REDUCE MY RISK OF HAVING A STROKE?   Stay physically active. Get at least 30 minutes of activity on most or all days.  Do not smoke. It may also be helpful to avoid exposure to secondhand smoke.  Limit alcohol use. Moderate alcohol use is considered to be:  No more than 2 drinks per day for men.  No more than 1 drink per day for nonpregnant women.  Eat healthy foods. This involves:  Eating 5 or more servings of fruits and vegetables a day.  Making dietary changes that address high blood pressure (hypertension), high cholesterol, diabetes, or obesity.  Manage your cholesterol levels.  Making food choices that are high in fiber and low in saturated fat, trans fat, and cholesterol may control cholesterol levels.  Take any prescribed medicines to control cholesterol as directed by your health care provider.  Manage your diabetes.  Controlling your carbohydrate and sugar intake is recommended to manage diabetes.  Take any prescribed medicines to control diabetes as directed by your health care provider.  Control  your hypertension.  Making food choices that are low in salt (sodium), saturated fat, trans fat, and cholesterol is recommended to manage hypertension.  Ask your health care provider if you need treatment to lower your blood pressure. Take any prescribed medicines to control hypertension as directed by your health care provider.  If you are 3718-639 years of age, have your blood pressure checked every 3-5 years. If you are 72 years of age or older, have your blood pressure checked every year.  Maintain a healthy weight.  Reducing calorie intake and making food choices that are low in sodium, saturated fat, trans fat, and cholesterol are recommended to manage weight.  Stop drug abuse.  Avoid taking birth control pills.  Talk to your health care provider about the risks of taking birth control pills if you are over 72 years old, smoke, get migraines, or have ever had a blood clot.  Get evaluated for sleep disorders (sleep apnea).  Talk to your health care provider about getting a sleep evaluation if you snore a lot or have excessive sleepiness.  Take medicines only as directed by your health care provider.  For some people, aspirin or blood thinners (anticoagulants) are helpful in reducing the risk of forming abnormal blood clots that can lead to stroke. If you have the irregular heart rhythm of atrial fibrillation, you should be on a blood thinner unless there is a good reason you cannot take them.  Understand all your medicine instructions.  Make sure that other conditions (such as anemia or atherosclerosis) are addressed. SEEK IMMEDIATE MEDICAL CARE IF:   You have sudden weakness or numbness of the face,  arm, or leg, especially on one side of the body.  Your face or eyelid droops to one side.  You have sudden confusion.  You have trouble speaking (aphasia) or understanding.  You have sudden trouble seeing in one or both eyes.  You have sudden trouble walking.  You have  dizziness.  You have a loss of balance or coordination.  You have a sudden, severe headache with no known cause.  You have new chest pain or an irregular heartbeat. Any of these symptoms may represent a serious problem that is an emergency. Do not wait to see if the symptoms will go away. Get medical help at once. Call your local emergency services (911 in [place] Do not drive yourself to the hospital.   This information is not intended to replace advice given to you by your health care provider. Make sure you discuss any questions you have with your health care provider.   Document Released: 06/20/2004 Document Revised: 06/03/2014 Document Reviewed: 11/13/2012 Elsevier Interactive Patient Education 2016 Elsevier Inc.   

## 2015-10-27 DIAGNOSIS — I679 Cerebrovascular disease, unspecified: Secondary | ICD-10-CM | POA: Diagnosis not present

## 2015-10-27 DIAGNOSIS — E785 Hyperlipidemia, unspecified: Secondary | ICD-10-CM | POA: Diagnosis not present

## 2015-10-27 DIAGNOSIS — Z1211 Encounter for screening for malignant neoplasm of colon: Secondary | ICD-10-CM | POA: Diagnosis not present

## 2015-10-27 DIAGNOSIS — H9201 Otalgia, right ear: Secondary | ICD-10-CM | POA: Diagnosis not present

## 2015-10-27 DIAGNOSIS — I10 Essential (primary) hypertension: Secondary | ICD-10-CM | POA: Diagnosis not present

## 2015-10-27 DIAGNOSIS — R109 Unspecified abdominal pain: Secondary | ICD-10-CM | POA: Diagnosis not present

## 2016-02-20 ENCOUNTER — Ambulatory Visit (INDEPENDENT_AMBULATORY_CARE_PROVIDER_SITE_OTHER): Payer: Medicare Other | Admitting: Nurse Practitioner

## 2016-02-20 ENCOUNTER — Encounter: Payer: Self-pay | Admitting: Nurse Practitioner

## 2016-02-20 VITALS — BP 139/69 | HR 47 | Ht 71.0 in | Wt 159.4 lb

## 2016-02-20 DIAGNOSIS — I10 Essential (primary) hypertension: Secondary | ICD-10-CM

## 2016-02-20 DIAGNOSIS — E785 Hyperlipidemia, unspecified: Secondary | ICD-10-CM

## 2016-02-20 DIAGNOSIS — I639 Cerebral infarction, unspecified: Secondary | ICD-10-CM

## 2016-02-20 NOTE — Progress Notes (Signed)
I have read the note, and I agree with the clinical assessment and plan.  Jacob Mckenzie,Jacob Mckenzie   

## 2016-02-20 NOTE — Patient Instructions (Signed)
Continue Plavix  for secondary stroke prevention  Strict control of hypertension with blood pressure goal below 130/90, today's reading 139/69 continue Lopressor hemoglobin A1c goal below 6.5%  Lipids with LDL cholesterol goal below 70 mg/dL. continue Lipitor Try to walk every day for exercise Follow-up in 6 months

## 2016-02-20 NOTE — Progress Notes (Signed)
[facility]  PATIENT: Jacob FlackBobby Mckenzie DOB: 02/12/1946   REASON FOR VISIT: Follow-up for CVA HISTORY FROM: Patient and wife    HISTORY OF PRESENT ILLNESS:UPDATE 09/26/2017CM Mr. Jacob KendallSizemore, 70 year old male returns for follow-up. He has a history of stroke event in January 2017. He is currently on Plavix for secondary stroke prevention with minimal bruising and no recurrent stroke or TIA symptoms. In addition he has continued to not smoke. He remains on Lipitor for hyperlipidemia and he denies muscle aches or myalgias. [facility] and wife states labs were recently done and okay. He walks for exercise. He returns for reevaluation  HISTORY: 08/21/15 PS69 year male seen for first office f/u visit [facility].Jacob FlackBobby Mckenzie is a 70 y.o. male with a history of heavy tobacco abuse who presents with several days of altered mental status. He complained of being lightheaded on Sunday, but did not have any confusion. On Monday, his wife began noticing that he would do things that were bizarre. He would say things which were not pertinent to the situation at hand or were completely nonsensical in current context. This has been persistent and therefore yesterday, his wife took him into an urgent care who referred him in to the emergency department. LKW: Monday tpa given?: no, out of window Premorbid modified rankin scale:   CT scan of the head on admission shows acute to subacute right anterior lentiform nucleus infarct extending into the centrum semiovale and remote age left basal ganglia infarct and moderate atrophy and white matter changes no acute abnormality. CT angiogram showed no significant extracranial carotid disease and no large vessel proximal stenosis intracranially as well. I have personally viewed imaging studies an MRI confirmed a large right subcortical infarct. LDL cholesterol was elevated at 150 and  hemoglobin A1c was 5.6. Transthoraxic   echo showed normal ejection fraction without cardiac source of embolism. Patient wasn't on aspirin and this was changed to Plavix for secondary stroke prevention and consult to quit smoking but he states he has done. He isn't good on branch. Is also tolerating Lipitor well without muscle aches pains or other side effects. He is on Plavix and having minor bruising but no bleeding. The patient's wife states that he still gets confused occasionally but not as much. Gradually getting better. His wife at times is softer. He continues to have mild balance difficulties but this is not constant. He has quit smoking as well as cut back caffeine intake. His blood pressure is well controlled and today it is 10169. I discussed perspiration and respect cases trial but patient is not interested.   REVIEW OF SYSTEMS: Full 14 system review of systems performed and notable only for those listed, all others are neg:  Constitutional: neg  Cardiovascular: neg Ear/Nose/Throat: neg  Skin: neg Eyes: neg Respiratory: Shortness of breath Gastroitestinal: neg  Hematology/Lymphatic: Easy bruising Endocrine: neg Musculoskeletal: Back pain neck pain Allergy/Immunology: neg Neurological: neg Psychiatric: neg Sleep : neg   ALLERGIES: No Known Allergies  HOME MEDICATIONS: Outpatient Medications Prior to Visit  Medication Sig Dispense Refill  . albuterol (PROVENTIL HFA;VENTOLIN HFA) 108 (90 Base) MCG/ACT inhaler Inhale 1-2 puffs into the lungs every 4 (four) hours as needed for wheezing or shortness of breath (ICD10code  J44.9). 1 Inhaler 5  . atorvastatin (LIPITOR) 40 MG tablet Take 1 tablet (40 mg total) by mouth at bedtime. 30 tablet 4  . clopidogrel (PLAVIX) 75 MG tablet Take 1 tablet (  75 mg total) by mouth daily. 30 tablet 5  . metoprolol tartrate (LOPRESSOR) 25 MG tablet Take 1 tablet (25 mg total) by mouth 2 (two) times daily. 60 tablet 3  . Multiple Vitamins-Minerals  (MULTIVITAMIN & MINERAL PO) Take 1 tablet by mouth daily.     No facility-administered medications prior to visit.     PAST MEDICAL HISTORY: Past Medical History:  Diagnosis Date  . COPD (chronic obstructive pulmonary disease) (HCC)   . Stroke (HCC)   . Tobacco abuse     PAST SURGICAL HISTORY: History reviewed. No pertinent surgical history.  FAMILY HISTORY: History reviewed. No pertinent family history.  SOCIAL HISTORY: Social History   Social History  . Marital status: Married    Spouse name: N/A  . Number of children: N/A  . Years of education: N/A   Occupational History  . Not on file.   Social History Main Topics  . Smoking status: Former Smoker    Years: 55.00    Quit date: 06/13/2015  . Smokeless tobacco: Never Used  . Alcohol use 1.2 oz/week    1 Cans of beer, 1 Shots of liquor per week     Comment: occasionally  . Drug use: No  . Sexual activity: No   Other Topics Concern  . Not on file   Social History Narrative  . No narrative on file     PHYSICAL EXAM  Vitals:   02/20/16 0814  BP: 139/69  Pulse: (!) 47  Weight: 159 lb 6.4 oz (72.3 kg)  Height: 5\' 11"  (1.803 m)   Body mass index is 22.23 kg/m. General: well developed, well nourished, seated, in no evident distress Head: head normocephalic and atraumatic.  Neck: supple with no carotid  bruits Cardiovascular: regular rate and rhythm, no murmurs Musculoskeletal: no deformity Skin:  no rash/petichiae Vascular:  Normal pulses all extremities  Neurological examination  Mental Status: Awake and fully alert. Oriented to place and time. Recent and remote memory intact. Attention span, concentration and fund of knowledge appropriate. Mood and affect appropriate.  Cranial Nerves:  Pupils equal, briskly reactive to light. Extraocular movements full without nystagmus. Visual fields full to confrontation. Hearing intact. Facial sensation intact. Face, tongue, palate moves normally and symmetrically.    Motor: Normal bulk and tone. Normal strength in all tested extremity muscles. Diminished fine finger movements on the left. Minimum weakness of left grip.  Sensory.: intact to touch ,pinprick .position and vibratory sensation In the upper and lower extremities.  Coordination: Rapid alternating movements normal in all extremities. Finger-to-nose and heel-to-shin performed accurately bilaterally. Gait and Station: Arises from chair without difficulty. Stance is normal. Gait demonstrates normal stride length and balance . Able to heel, toe and tandem walk with mild difficulty.  Reflexes: 1+ and symmetric. Toes downgoing.    DIAGNOSTIC DATA (LABS, IMAGING, TESTING) - I reviewed patient records, labs, notes, testing and imaging myself where available.  Lab Results  Component Value Date   WBC 12.7 (H) 06/17/2015   HGB 12.9 (L) 06/17/2015   HCT 39.2 06/17/2015   MCV 94.5 06/17/2015   PLT 233 06/17/2015      Component Value Date/Time   NA 142 06/17/2015 0237   K 4.3 06/17/2015 0237   CL 109 06/17/2015 0237   CO2 26 06/17/2015 0237   GLUCOSE 104 (H) 06/17/2015 0237   BUN 11 06/17/2015 0237   CREATININE 0.63 06/17/2015 0237   CALCIUM 9.0 06/17/2015 0237   PROT 6.4 (L) 06/16/2015 1900   ALBUMIN  3.4 (L) 06/16/2015 1900   AST 30 06/16/2015 1900   ALT 13 (L) 06/16/2015 1900   ALKPHOS 64 06/16/2015 1900   BILITOT 0.3 06/16/2015 1900   GFRNONAA >60 06/17/2015 0237   GFRAA >60 06/17/2015 0237   Lab Results  Component Value Date   CHOL 204 (H) 06/15/2015   HDL 37 (L) 06/15/2015   LDLCALC 150 (H) 06/15/2015   TRIG 86 06/15/2015   CHOLHDL 5.5 06/15/2015   Lab Results  Component Value Date   HGBA1C 5.6 06/15/2015    ASSESSMENT AND PLAN 70 year with 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 The patient is a current patient of Dr. Pearlean Brownie  who is out of the office today .  This note is sent to the work in doctor.     PLAN: Continue Plavix  for secondary stroke prevention  Strict control of hypertension with blood pressure goal below 130/90, today's reading 139/69 continue Lopressor hemoglobin A1c goal below 6.5%  Lipids with LDL cholesterol goal below 70 mg/dL. continue Lipitor Try to walk every day for exercise Follow-up in 6 months if stable will dismiss Nilda Riggs, [facility], [facility], APRN  [facility] [address] [place], [place] [address] [phone]

## 2016-04-05 DIAGNOSIS — Z23 Encounter for immunization: Secondary | ICD-10-CM | POA: Diagnosis not present

## 2016-04-30 DIAGNOSIS — M545 Low back pain: Secondary | ICD-10-CM | POA: Diagnosis not present

## 2016-04-30 DIAGNOSIS — I10 Essential (primary) hypertension: Secondary | ICD-10-CM | POA: Diagnosis not present

## 2016-04-30 DIAGNOSIS — H9190 Unspecified hearing loss, unspecified ear: Secondary | ICD-10-CM | POA: Diagnosis not present

## 2016-08-19 ENCOUNTER — Ambulatory Visit (INDEPENDENT_AMBULATORY_CARE_PROVIDER_SITE_OTHER): Payer: Medicare Other | Admitting: Nurse Practitioner

## 2016-08-19 ENCOUNTER — Encounter: Payer: Self-pay | Admitting: Nurse Practitioner

## 2016-08-19 VITALS — BP 108/66 | HR 50 | Ht 71.0 in | Wt 169.0 lb

## 2016-08-19 DIAGNOSIS — I10 Essential (primary) hypertension: Secondary | ICD-10-CM

## 2016-08-19 DIAGNOSIS — I639 Cerebral infarction, unspecified: Secondary | ICD-10-CM

## 2016-08-19 DIAGNOSIS — E785 Hyperlipidemia, unspecified: Secondary | ICD-10-CM | POA: Diagnosis not present

## 2016-08-19 NOTE — Progress Notes (Signed)
[facility]  PATIENT: Jacob Mckenzie DOB: 29-Sep-1945   REASON FOR VISIT: Follow-up for CVA HISTORY FROM: Patient and wife    HISTORY OF PRESENT ILLNESS:UPDATE 03/26/2018CM Jacob Mckenzie, 71 year old male returns for follow-up right subcortical infarct. In January 2017. He is currently on Plavix for secondary stroke prevention without further stroke or TIA symptoms. He has no bruising and no bleeding. He is also on Lipitor for hyperlipidemia and denies any myalgias. He is due for repeat labs with Dr. Tommi Emery in June. He continues to walk for exercise and uses a balance stick. He has not had any falls. Blood pressure office today 108/66. He has no new neurologic complaints. He returns for reevaluation   UPDATE 09/26/2017CM Jacob Mckenzie, 71 year old male returns for follow-up. He has a history of stroke event in January 2017. He is currently on Plavix for secondary stroke prevention with minimal bruising and no recurrent stroke or TIA symptoms. In addition he has continued to not smoke. He remains on Lipitor for hyperlipidemia and he denies muscle aches or myalgias. His labs are followed through [facility] physician and wife states labs were recently done and okay. He walks for exercise. He returns for reevaluation  HISTORY: 08/21/15 PS69 year male seen for first office f/u visit after [facility] admission in January 2017 for stroke.[name] is a 71 y.o. male with a history of heavy tobacco abuse who presents with several days of altered mental status. He complained of being lightheaded on Sunday, but did not have any confusion. On Monday, his wife began noticing that he would do things that were bizarre. He would say things which were not pertinent to the situation at hand or were completely nonsensical in current context. This has been persistent and therefore yesterday, his wife took him into an urgent care who referred him in to the emergency department. LKW: Monday tpa given?: no,  out of window Premorbid modified rankin scale:   CT scan of the head on admission shows acute to subacute right anterior lentiform nucleus infarct extending into the centrum semiovale and remote age left basal ganglia infarct and moderate atrophy and white matter changes no acute abnormality. CT angiogram showed no significant extracranial carotid disease and no large vessel proximal stenosis intracranially as well. I have personally viewed imaging studies an MRI confirmed a large right subcortical infarct. LDL cholesterol was elevated at 150 and hemoglobin A1c was 5.6. Transthoraxic   echo showed normal ejection fraction without cardiac source of embolism. Patient wasn't on aspirin and this was changed to Plavix for secondary stroke prevention and consult to quit smoking but he states he has done. He isn't good on branch. Is also tolerating Lipitor well without muscle aches pains or other side effects. He is on Plavix and having minor bruising but no bleeding. The patient's wife states that he still gets confused occasionally but not as much. Gradually getting better. His wife at times is softer. He continues to have mild balance difficulties but this is not constant. He has quit smoking as well as cut back caffeine intake. His blood pressure is well controlled and today it is [address]. I discussed perspiration and respect cases trial but patient is not interested.   REVIEW OF SYSTEMS: Full 14 system review of systems performed and notable only for those listed, all others are neg:  Constitutional: neg  Cardiovascular: neg Ear/Nose/Throat: neg  Skin: neg Eyes: neg Respiratory: neg Gastroitestinal: neg  Hematology/Lymphatic: neg Endocrine: neg Musculoskeletal: neg Allergy/Immunology: neg Neurological: neg Psychiatric: neg  Sleep : neg   ALLERGIES: No Known Allergies  HOME MEDICATIONS: Outpatient Medications Prior to Visit  Medication Sig Dispense Refill  . albuterol (PROVENTIL HFA;VENTOLIN  HFA) 108 (90 Base) MCG/ACT inhaler Inhale 1-2 puffs into the lungs every 4 (four) hours as needed for wheezing or shortness of breath (ICD10code  J44.9). 1 Inhaler 5  . atorvastatin (LIPITOR) 40 MG tablet Take 1 tablet (40 mg total) by mouth at bedtime. 30 tablet 4  . clopidogrel (PLAVIX) 75 MG tablet Take 1 tablet (75 mg total) by mouth daily. 30 tablet 5  . metoprolol tartrate (LOPRESSOR) 25 MG tablet Take 1 tablet (25 mg total) by mouth 2 (two) times daily. 60 tablet 3  . Multiple Vitamins-Minerals (MULTIVITAMIN & MINERAL PO) Take 1 tablet by mouth daily.     No facility-administered medications prior to visit.     PAST MEDICAL HISTORY: Past Medical History:  Diagnosis Date  . COPD (chronic obstructive pulmonary disease) (HCC)   . Stroke (HCC)   . Tobacco abuse     PAST SURGICAL HISTORY: History reviewed. No pertinent surgical history.  FAMILY HISTORY: History reviewed. No pertinent family history.  SOCIAL HISTORY: Social History   Social History  . Marital status: Married    Spouse name: N/A  . Number of children: N/A  . Years of education: N/A   Occupational History  . Not on file.   Social History Main Topics  . Smoking status: Former Smoker    Years: 55.00    Quit date: 06/13/2015  . Smokeless tobacco: Never Used  . Alcohol use 1.2 oz/week    1 Cans of beer, 1 Shots of liquor per week     Comment: occasionally  . Drug use: No  . Sexual activity: No   Other Topics Concern  . Not on file   Social History Narrative  . No narrative on file     PHYSICAL EXAM  Vitals:   08/19/16 0949  BP: 108/66  Pulse: (!) 50  Weight: 169 lb (76.7 kg)  Height: 5\' 11"  (1.803 m)   Body mass index is 23.57 kg/m.Marland Kitchen General: well developed, well nourished, seated, in no evident distress Head: head normocephalic and atraumatic.  Neck: supple with no carotid  bruits Cardiovascular: regular rate and rhythm, no murmurs Musculoskeletal: no deformity Skin:  no  rash/petichiae Neurological examination  Mental Status: Awake and fully alert. Oriented to place and time. Recent and remote memory intact. Attention span, concentration and fund of knowledge appropriate. Mood and affect appropriate.  Cranial Nerves:  Pupils equal, briskly reactive to light. Extraocular movements full without nystagmus. Visual fields full to confrontation. Hearing intact. Facial sensation intact. Face, tongue, palate moves normally and symmetrically.  Motor: Normal bulk and tone. Normal strength in all tested extremity muscles. . Minimum weakness of left grip.  Sensory.: intact to touch ,pinprick .position and vibratory sensation In the upper and lower extremities.  Coordination: Rapid alternating movements normal in all extremities. Finger-to-nose and heel-to-shin performed accurately bilaterally. Gait and Station: Arises from chair without difficulty. Stance is normal. Gait demonstrates normal stride length and balance . Able to heel, toe and tandem walk with mild difficulty.  Reflexes: 1+ and symmetric. Toes downgoing.    DIAGNOSTIC DATA (LABS, IMAGING, TESTING) - I reviewed patient records, labs, notes, testing and imaging myself where available.  Lab Results  Component Value Date   WBC 12.7 (H) 06/17/2015   HGB 12.9 (L) 06/17/2015   HCT 39.2 06/17/2015   MCV 94.5 06/17/2015  PLT 233 06/17/2015      Component Value Date/Time   NA 142 06/17/2015 0237   K 4.3 06/17/2015 0237   CL 109 06/17/2015 0237   CO2 26 06/17/2015 0237   GLUCOSE 104 (H) 06/17/2015 0237   BUN 11 06/17/2015 0237   CREATININE 0.63 06/17/2015 0237   CALCIUM 9.0 06/17/2015 0237   PROT 6.4 (L) 06/16/2015 1900   ALBUMIN 3.4 (L) 06/16/2015 1900   AST 30 06/16/2015 1900   ALT 13 (L) 06/16/2015 1900   ALKPHOS 64 06/16/2015 1900   BILITOT 0.3 06/16/2015 1900   GFRNONAA >60 06/17/2015 0237   GFRAA >60 06/17/2015 0237   Lab Results  Component Value Date   CHOL 204 (H) 06/15/2015   HDL 37 (L)  06/15/2015   LDLCALC 150 (H) 06/15/2015   TRIG 86 06/15/2015   CHOLHDL 5.5 06/15/2015   Lab Results  Component Value Date   HGBA1C 5.6 06/15/2015    ASSESSMENT AND PLAN 70 year with 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    PLAN: Management of stroke risk factors Continue Plavix  for secondary stroke prevention  Strict control of hypertension with blood pressure goal below 130/90, today's reading 108/66  Lipids with LDL cholesterol goal below 70 mg/dL. continue Lipitor Try to walk every day for exercise use walking stick for stability Patient has stopped smoking  Will discharge from stroke clinic REMEMBER Pneumonic FAST which stands for  F stands  for face drooping and weakness etc. A stands for arms, weakness S stands for speech slurred  T stands for time to call 911 I spent 20 min in total face to face time with the patient more than 50% of which was spent counseling and coordination of care, reviewing test results reviewing medications and discussing and reviewing the diagnosis of stroke and continued management of risk factors Nilda RiggsNancy Carolyn Zoiey Christy, [facility] IncGNP, [facility] CenterBC, APRN  [facility] [address], [address] [place], [place] [address] [phone]

## 2016-08-19 NOTE — Patient Instructions (Addendum)
Management of stroke risk factors Continue Plavix  for secondary stroke prevention  Strict control of hypertension with blood pressure goal below 130/90, today's reading 108/66  Lipids with LDL cholesterol goal below 70 mg/dL. continue Lipitor Try to walk every day for exercise use walking stick for stability Will discharge REMEMBER Pneumonic FAST which stands for  F stands  for face drooping and weakness etc. A stands for arms, weakness S stands for speech slurred  T stands for time to call 911 Stroke Prevention Some medical conditions and behaviors are associated with an increased chance of having a stroke. You may prevent a stroke by making healthy choices and managing medical conditions. How can I reduce my risk of having a stroke?  Stay physically active. Get at least 30 minutes of activity on most or all days.  Do not smoke. It may also be helpful to avoid exposure to secondhand smoke.  Limit alcohol use. Moderate alcohol use is considered to be:  No more than 2 drinks per day for men.  No more than 1 drink per day for nonpregnant women.  Eat healthy foods. This involves:  Eating 5 or more servings of fruits and vegetables a day.  Making dietary changes that address high blood pressure (hypertension), high cholesterol, diabetes, or obesity.  Manage your cholesterol levels.  Making food choices that are high in fiber and low in saturated fat, trans fat, and cholesterol may control cholesterol levels.  Take any prescribed medicines to control cholesterol as directed by your health care provider.  Manage your diabetes.  Controlling your carbohydrate and sugar intake is recommended to manage diabetes.  Take any prescribed medicines to control diabetes as directed by your health care provider.  Control your hypertension.  Making food choices that are low in salt (sodium), saturated fat, trans fat, and cholesterol is recommended to manage hypertension.  Ask your health  care provider if you need treatment to lower your blood pressure. Take any prescribed medicines to control hypertension as directed by your health care provider.  If you are 10618-439 years of age, have your blood pressure checked every 3-5 years. If you are 73 years of age or older, have your blood pressure checked every year.  Maintain a healthy weight.  Reducing calorie intake and making food choices that are low in sodium, saturated fat, trans fat, and cholesterol are recommended to manage weight.  Stop drug abuse.  Avoid taking birth control pills.  Talk to your health care provider about the risks of taking birth control pills if you are over 367 years old, smoke, get migraines, or have ever had a blood clot.  Get evaluated for sleep disorders (sleep apnea).  Talk to your health care provider about getting a sleep evaluation if you snore a lot or have excessive sleepiness.  Take medicines only as directed by your health care provider.  For some people, aspirin or blood thinners (anticoagulants) are helpful in reducing the risk of forming abnormal blood clots that can lead to stroke. If you have the irregular heart rhythm of atrial fibrillation, you should be on a blood thinner unless there is a good reason you cannot take them.  Understand all your medicine instructions.  Make sure that other conditions (such as anemia or atherosclerosis) are addressed. Get help right away if:  You have sudden weakness or numbness of the face, arm, or leg, especially on one side of the body.  Your face or eyelid droops to one side.  You have  sudden confusion.  You have trouble speaking (aphasia) or understanding.  You have sudden trouble seeing in one or both eyes.  You have sudden trouble walking.  You have dizziness.  You have a loss of balance or coordination.  You have a sudden, severe headache with no known cause.  You have new chest pain or an irregular heartbeat. Any of these  symptoms may represent a serious problem that is an emergency. Do not wait to see if the symptoms will go away. Get medical help at once. Call your local emergency services (911 in [place] Do not drive yourself to the hospital. This information is not intended to replace advice given to you by your health care provider. Make sure you discuss any questions you have with your health care provider. Document Released: 06/20/2004 Document Revised: 10/19/2015 Document Reviewed: 11/13/2012 Elsevier Interactive Patient Education  2017 [employer].

## 2016-08-23 NOTE — Progress Notes (Signed)
I agree with the above plan 

## 2016-09-05 IMAGING — MR MR HEAD W/O CM
8 of 10 series · 37 of 48 positions shown · non-contrast
Comparison: CT head without contrast 06/14/2015.

CLINICAL DATA: Confusion over the last 3 days. Generalized
weakness. Mental status change.

EXAM:
MRI HEAD WITHOUT CONTRAST
TECHNIQUE: Multiplanar, multiecho pulse sequences of the brain and surrounding
structures were obtained without intravenous contrast.

[Series 3: DWI · axial · 3.0mm · 1.09mm/px · z∈[-94,+52]mm · 11 of 112 slices shown (1 of 4)]
[im 1/112]
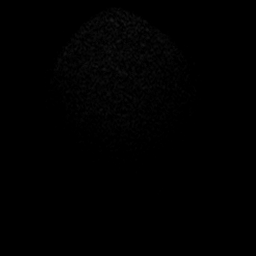
[im 12/112]
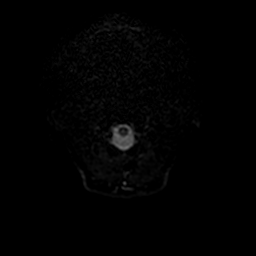
[im 23/112]
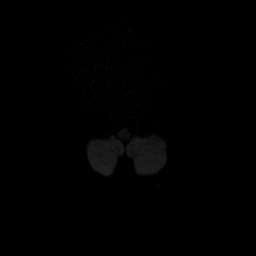
[im 34/112]
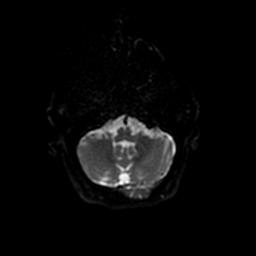
[im 45/112]
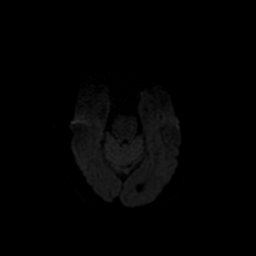
[im 56/112]
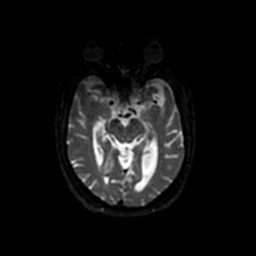
[im 67/112]
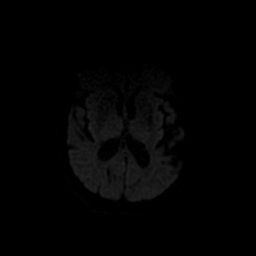
[im 78/112]
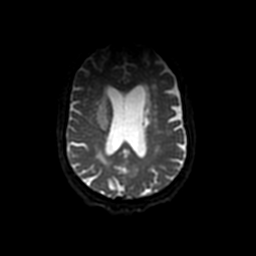
[im 89/112]
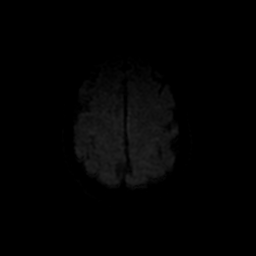
[im 100/112]
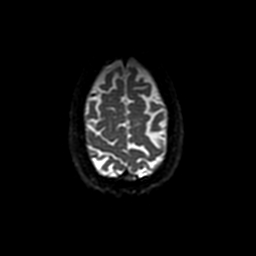
[im 112/112]
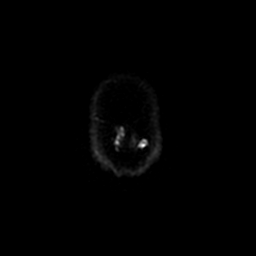

[Series 4: T1 · sagittal · 5.0mm · 0.47mm/px · 1 of 24 slices shown]
[im 1/24]
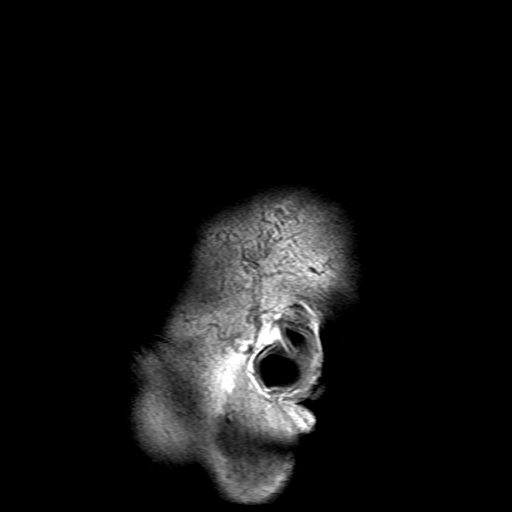

[Series 5: DWI · coronal · 5.0mm · 1.09mm/px · 7 of 64 slices shown (2 of 4)]
[im 1/64]
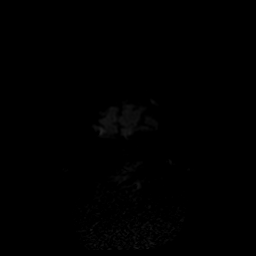
[im 11/64]
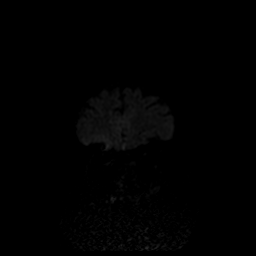
[im 22/64]
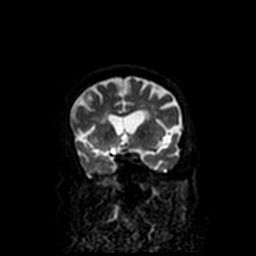
[im 32/64]
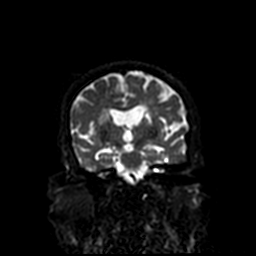
[im 43/64]
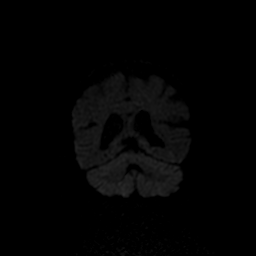
[im 53/64]
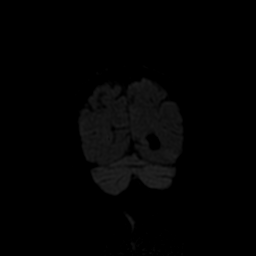
[im 64/64]
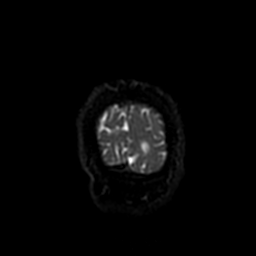

[Series 6: T2 · axial · 5.0mm · 0.43mm/px · z∈[-73,+71]mm · 3 of 28 slices shown (1 of 2)]
[im 1/28]
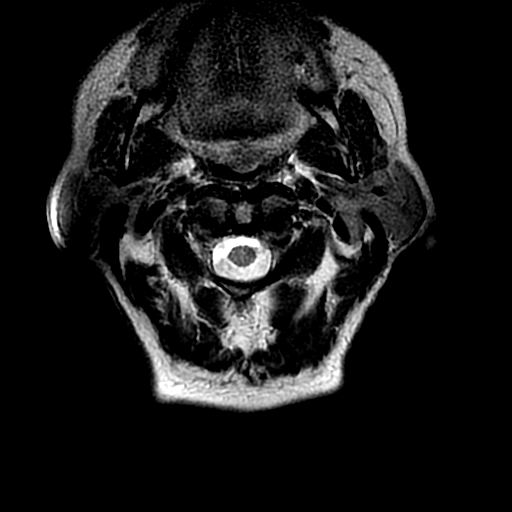
[im 14/28]
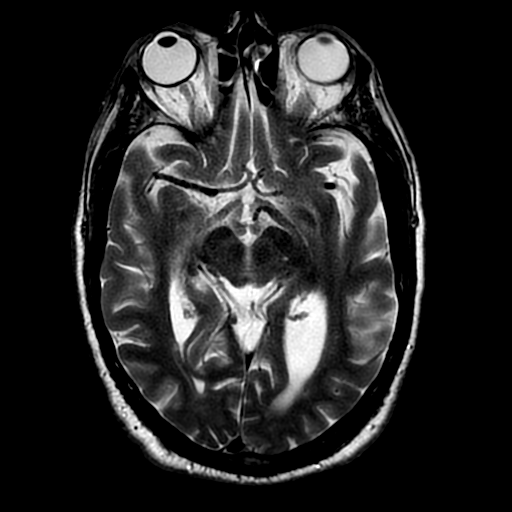
[im 28/28]
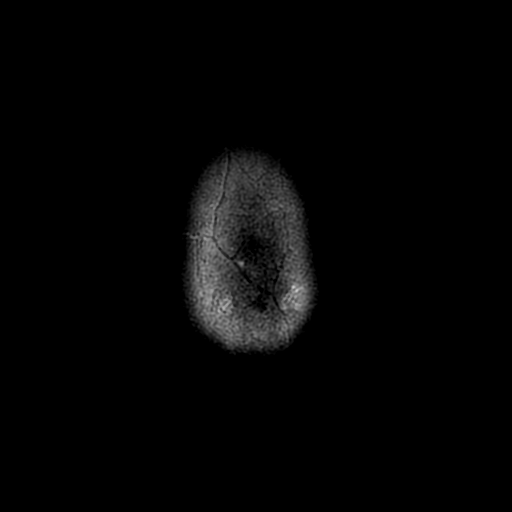

[Series 7: FLAIR · axial · 5.0mm · 0.43mm/px · z∈[-73,+71]mm · 3 of 28 slices shown]
[im 1/28]
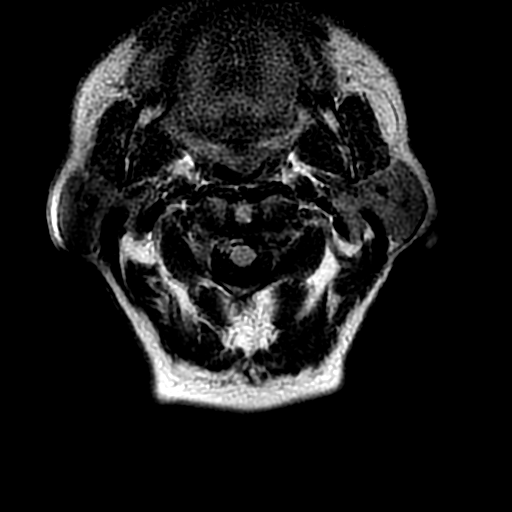
[im 14/28]
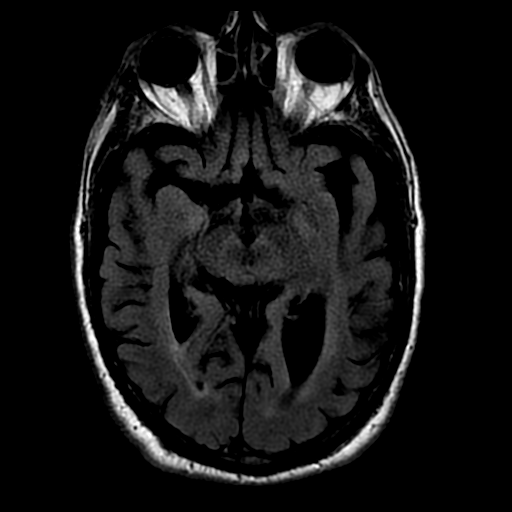
[im 28/28]
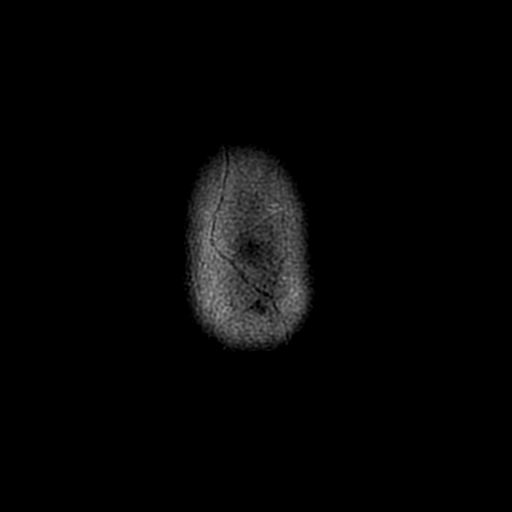

[Series 10: T2 · coronal · 5.0mm · 0.47mm/px · 3 of 24 slices shown (2 of 2)]
[im 1/24]
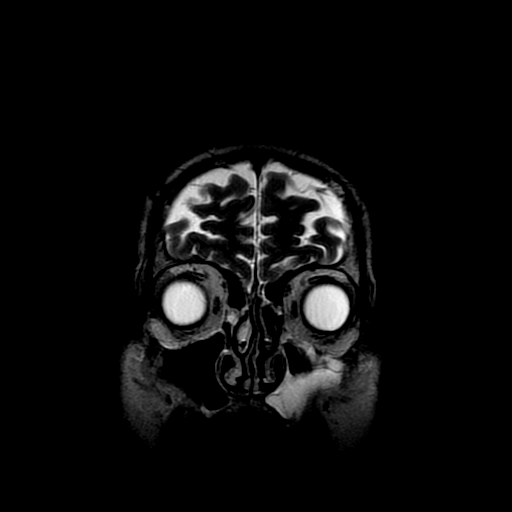
[im 12/24]
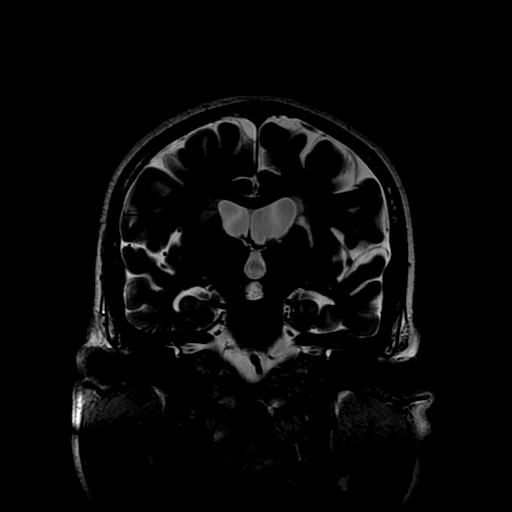
[im 24/24]
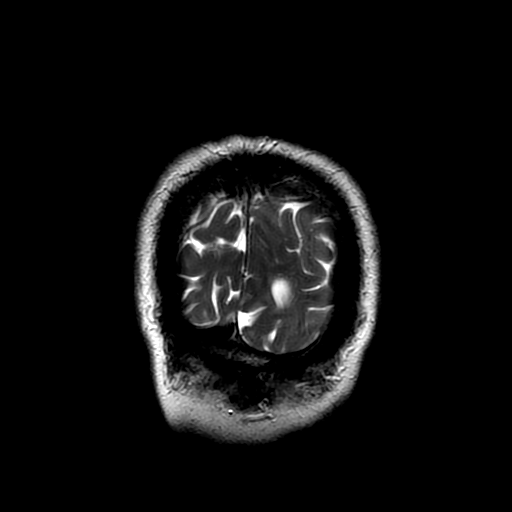

[Series 300: DWI · axial · 3.0mm · 1.09mm/px · z∈[-94,+52]mm · 6 of 56 slices shown (3 of 4)]
[im 1/56]
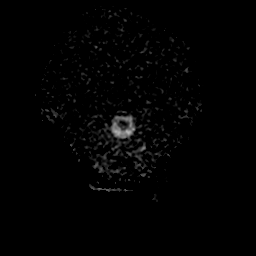
[im 12/56]
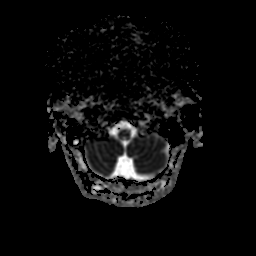
[im 23/56]
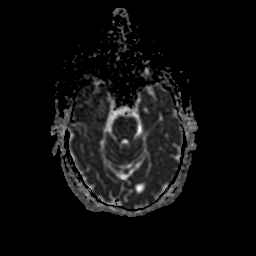
[im 34/56]
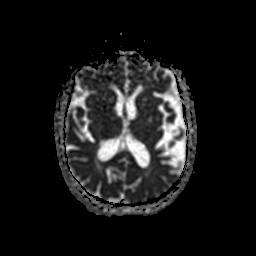
[im 45/56]
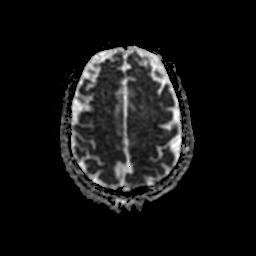
[im 56/56]
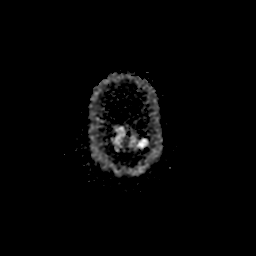

[Series 500: DWI · coronal · 5.0mm · 1.09mm/px · 3 of 32 slices shown (4 of 4)]
[im 1/32]
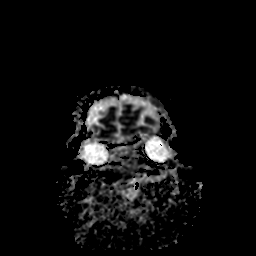
[im 16/32]
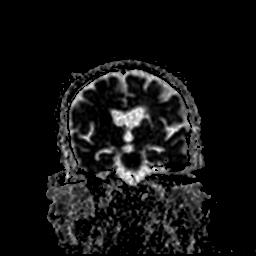
[im 32/32]
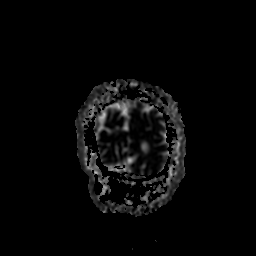

[37 of 48 positions shown; findings below may reference images not displayed]

FINDINGS: The diffusion-weighted images demonstrate an acute nonhemorrhagic
infarct involving the right lentiform nucleus and extending
superiorly within the centrum semiovale. There is probable
involvement of the anterior limb of the right internal capsule. The
right caudate head is spared.

A remote infarct of the left lentiform nucleus is similar.

Moderate periventricular and subcortical T2 changes are present
bilaterally. No other acute infarct is present.

The brainstem and cerebellum are within normal limits. The internal
auditory canals are unremarkable.

Flow is present in the major intracranial arteries. The globes and
orbits are intact.

Moderate circumferential mucosal thickening is present in the left
maxillary sinus. There is mild mucosal thickening in the anterior
ethmoid air cells and left frontal sinus. The right frontal sinus is
hypoplastic and height opacified. A small amount of fluid is present
in the mastoid air cells. No obstructing nasopharyngeal lesion is
evident. The skullbase is normal. Midline sagittal images
demonstrate degenerative change at C3-4. No focal intracranial
lesions are evident.
IMPRESSION: 1. Acute/subacute nonhemorrhagic 4 cm infarct involving the right
lentiform nucleus extends superiorly into the centrum semiovale with
possible involvement of the anterior limb right internal capsule.
2. Remote infarct of the left lentiform nucleus a similar location.
3. Moderate age advanced periventricular and subcortical white
matter disease bilaterally likely reflects the sequela of chronic
microvascular ischemia.

## 2016-10-28 DIAGNOSIS — R05 Cough: Secondary | ICD-10-CM | POA: Diagnosis not present

## 2016-10-28 DIAGNOSIS — I679 Cerebrovascular disease, unspecified: Secondary | ICD-10-CM | POA: Diagnosis not present

## 2016-10-28 DIAGNOSIS — E785 Hyperlipidemia, unspecified: Secondary | ICD-10-CM | POA: Diagnosis not present

## 2016-10-28 DIAGNOSIS — I10 Essential (primary) hypertension: Secondary | ICD-10-CM | POA: Diagnosis not present

## 2016-10-28 DIAGNOSIS — Z1159 Encounter for screening for other viral diseases: Secondary | ICD-10-CM | POA: Diagnosis not present

## 2016-10-28 DIAGNOSIS — Z1211 Encounter for screening for malignant neoplasm of colon: Secondary | ICD-10-CM | POA: Diagnosis not present

## 2016-10-28 DIAGNOSIS — Z125 Encounter for screening for malignant neoplasm of prostate: Secondary | ICD-10-CM | POA: Diagnosis not present

## 2016-10-28 DIAGNOSIS — Z23 Encounter for immunization: Secondary | ICD-10-CM | POA: Diagnosis not present

## 2016-10-28 DIAGNOSIS — Z0000 Encounter for general adult medical examination without abnormal findings: Secondary | ICD-10-CM | POA: Diagnosis not present

## 2016-12-09 DIAGNOSIS — Z1211 Encounter for screening for malignant neoplasm of colon: Secondary | ICD-10-CM | POA: Diagnosis not present

## 2017-03-24 DIAGNOSIS — Z23 Encounter for immunization: Secondary | ICD-10-CM | POA: Diagnosis not present

## 2017-11-05 DIAGNOSIS — R05 Cough: Secondary | ICD-10-CM | POA: Diagnosis not present

## 2017-11-05 DIAGNOSIS — E785 Hyperlipidemia, unspecified: Secondary | ICD-10-CM | POA: Diagnosis not present

## 2017-11-05 DIAGNOSIS — I10 Essential (primary) hypertension: Secondary | ICD-10-CM | POA: Diagnosis not present

## 2017-11-05 DIAGNOSIS — I679 Cerebrovascular disease, unspecified: Secondary | ICD-10-CM | POA: Diagnosis not present

## 2017-11-25 DIAGNOSIS — H2513 Age-related nuclear cataract, bilateral: Secondary | ICD-10-CM | POA: Diagnosis not present

## 2018-02-25 DIAGNOSIS — Z23 Encounter for immunization: Secondary | ICD-10-CM | POA: Diagnosis not present

## 2018-05-22 DIAGNOSIS — I679 Cerebrovascular disease, unspecified: Secondary | ICD-10-CM | POA: Diagnosis not present

## 2018-05-22 DIAGNOSIS — I10 Essential (primary) hypertension: Secondary | ICD-10-CM | POA: Diagnosis not present

## 2018-05-22 DIAGNOSIS — E785 Hyperlipidemia, unspecified: Secondary | ICD-10-CM | POA: Diagnosis not present

## 2018-05-22 DIAGNOSIS — J449 Chronic obstructive pulmonary disease, unspecified: Secondary | ICD-10-CM | POA: Diagnosis not present

## 2018-05-22 DIAGNOSIS — Z1211 Encounter for screening for malignant neoplasm of colon: Secondary | ICD-10-CM | POA: Diagnosis not present

## 2018-12-30 DIAGNOSIS — I679 Cerebrovascular disease, unspecified: Secondary | ICD-10-CM | POA: Diagnosis not present

## 2018-12-30 DIAGNOSIS — E785 Hyperlipidemia, unspecified: Secondary | ICD-10-CM | POA: Diagnosis not present

## 2018-12-30 DIAGNOSIS — Z23 Encounter for immunization: Secondary | ICD-10-CM | POA: Diagnosis not present

## 2018-12-30 DIAGNOSIS — I10 Essential (primary) hypertension: Secondary | ICD-10-CM | POA: Diagnosis not present

## 2018-12-30 DIAGNOSIS — J449 Chronic obstructive pulmonary disease, unspecified: Secondary | ICD-10-CM | POA: Diagnosis not present

## 2019-03-08 DIAGNOSIS — Z23 Encounter for immunization: Secondary | ICD-10-CM | POA: Diagnosis not present

## 2019-07-05 DIAGNOSIS — I10 Essential (primary) hypertension: Secondary | ICD-10-CM | POA: Diagnosis not present

## 2019-07-05 DIAGNOSIS — E785 Hyperlipidemia, unspecified: Secondary | ICD-10-CM | POA: Diagnosis not present

## 2020-02-08 DIAGNOSIS — Z23 Encounter for immunization: Secondary | ICD-10-CM | POA: Diagnosis not present

## 2020-06-08 DIAGNOSIS — I10 Essential (primary) hypertension: Secondary | ICD-10-CM | POA: Diagnosis not present

## 2020-06-08 DIAGNOSIS — E785 Hyperlipidemia, unspecified: Secondary | ICD-10-CM | POA: Diagnosis not present

## 2020-06-08 DIAGNOSIS — I639 Cerebral infarction, unspecified: Secondary | ICD-10-CM | POA: Diagnosis not present

## 2020-06-08 DIAGNOSIS — J449 Chronic obstructive pulmonary disease, unspecified: Secondary | ICD-10-CM | POA: Diagnosis not present

## 2020-06-27 DIAGNOSIS — E785 Hyperlipidemia, unspecified: Secondary | ICD-10-CM | POA: Diagnosis not present

## 2020-06-27 DIAGNOSIS — I639 Cerebral infarction, unspecified: Secondary | ICD-10-CM | POA: Diagnosis not present

## 2020-06-27 DIAGNOSIS — I679 Cerebrovascular disease, unspecified: Secondary | ICD-10-CM | POA: Diagnosis not present

## 2020-06-27 DIAGNOSIS — J449 Chronic obstructive pulmonary disease, unspecified: Secondary | ICD-10-CM | POA: Diagnosis not present

## 2020-06-27 DIAGNOSIS — I10 Essential (primary) hypertension: Secondary | ICD-10-CM | POA: Diagnosis not present

## 2020-08-23 DIAGNOSIS — I10 Essential (primary) hypertension: Secondary | ICD-10-CM | POA: Diagnosis not present

## 2020-08-23 DIAGNOSIS — E785 Hyperlipidemia, unspecified: Secondary | ICD-10-CM | POA: Diagnosis not present

## 2020-08-23 DIAGNOSIS — J449 Chronic obstructive pulmonary disease, unspecified: Secondary | ICD-10-CM | POA: Diagnosis not present

## 2020-08-23 DIAGNOSIS — I639 Cerebral infarction, unspecified: Secondary | ICD-10-CM | POA: Diagnosis not present

## 2020-10-02 DIAGNOSIS — J449 Chronic obstructive pulmonary disease, unspecified: Secondary | ICD-10-CM | POA: Diagnosis not present

## 2020-10-02 DIAGNOSIS — I10 Essential (primary) hypertension: Secondary | ICD-10-CM | POA: Diagnosis not present

## 2020-10-02 DIAGNOSIS — E785 Hyperlipidemia, unspecified: Secondary | ICD-10-CM | POA: Diagnosis not present

## 2020-10-02 DIAGNOSIS — I639 Cerebral infarction, unspecified: Secondary | ICD-10-CM | POA: Diagnosis not present

## 2020-12-08 DIAGNOSIS — E785 Hyperlipidemia, unspecified: Secondary | ICD-10-CM | POA: Diagnosis not present

## 2020-12-08 DIAGNOSIS — I10 Essential (primary) hypertension: Secondary | ICD-10-CM | POA: Diagnosis not present

## 2020-12-08 DIAGNOSIS — I639 Cerebral infarction, unspecified: Secondary | ICD-10-CM | POA: Diagnosis not present

## 2020-12-08 DIAGNOSIS — J449 Chronic obstructive pulmonary disease, unspecified: Secondary | ICD-10-CM | POA: Diagnosis not present

## 2021-02-18 DIAGNOSIS — I639 Cerebral infarction, unspecified: Secondary | ICD-10-CM | POA: Diagnosis not present

## 2021-02-18 DIAGNOSIS — E785 Hyperlipidemia, unspecified: Secondary | ICD-10-CM | POA: Diagnosis not present

## 2021-02-18 DIAGNOSIS — I10 Essential (primary) hypertension: Secondary | ICD-10-CM | POA: Diagnosis not present

## 2021-02-18 DIAGNOSIS — J449 Chronic obstructive pulmonary disease, unspecified: Secondary | ICD-10-CM | POA: Diagnosis not present

## 2021-03-22 DIAGNOSIS — Z23 Encounter for immunization: Secondary | ICD-10-CM | POA: Diagnosis not present

## 2021-04-17 DIAGNOSIS — J449 Chronic obstructive pulmonary disease, unspecified: Secondary | ICD-10-CM | POA: Diagnosis not present

## 2021-04-17 DIAGNOSIS — I639 Cerebral infarction, unspecified: Secondary | ICD-10-CM | POA: Diagnosis not present

## 2021-04-17 DIAGNOSIS — I10 Essential (primary) hypertension: Secondary | ICD-10-CM | POA: Diagnosis not present

## 2021-04-17 DIAGNOSIS — E785 Hyperlipidemia, unspecified: Secondary | ICD-10-CM | POA: Diagnosis not present

## 2021-06-28 DIAGNOSIS — I639 Cerebral infarction, unspecified: Secondary | ICD-10-CM | POA: Diagnosis not present

## 2021-06-28 DIAGNOSIS — Z1211 Encounter for screening for malignant neoplasm of colon: Secondary | ICD-10-CM | POA: Diagnosis not present

## 2021-06-28 DIAGNOSIS — J449 Chronic obstructive pulmonary disease, unspecified: Secondary | ICD-10-CM | POA: Diagnosis not present

## 2021-06-28 DIAGNOSIS — Z0000 Encounter for general adult medical examination without abnormal findings: Secondary | ICD-10-CM | POA: Diagnosis not present

## 2021-06-28 DIAGNOSIS — I10 Essential (primary) hypertension: Secondary | ICD-10-CM | POA: Diagnosis not present

## 2021-06-28 DIAGNOSIS — E785 Hyperlipidemia, unspecified: Secondary | ICD-10-CM | POA: Diagnosis not present

## 2021-06-29 DIAGNOSIS — Z1211 Encounter for screening for malignant neoplasm of colon: Secondary | ICD-10-CM | POA: Diagnosis not present

## 2022-03-12 DIAGNOSIS — Z23 Encounter for immunization: Secondary | ICD-10-CM | POA: Diagnosis not present

## 2022-09-06 DIAGNOSIS — J449 Chronic obstructive pulmonary disease, unspecified: Secondary | ICD-10-CM | POA: Diagnosis not present

## 2022-09-06 DIAGNOSIS — R195 Other fecal abnormalities: Secondary | ICD-10-CM | POA: Diagnosis not present

## 2022-09-06 DIAGNOSIS — I679 Cerebrovascular disease, unspecified: Secondary | ICD-10-CM | POA: Diagnosis not present

## 2022-09-06 DIAGNOSIS — Z0000 Encounter for general adult medical examination without abnormal findings: Secondary | ICD-10-CM | POA: Diagnosis not present

## 2022-09-06 DIAGNOSIS — R399 Unspecified symptoms and signs involving the genitourinary system: Secondary | ICD-10-CM | POA: Diagnosis not present

## 2022-09-06 DIAGNOSIS — E785 Hyperlipidemia, unspecified: Secondary | ICD-10-CM | POA: Diagnosis not present

## 2022-09-06 DIAGNOSIS — R3911 Hesitancy of micturition: Secondary | ICD-10-CM | POA: Diagnosis not present

## 2022-09-06 DIAGNOSIS — I10 Essential (primary) hypertension: Secondary | ICD-10-CM | POA: Diagnosis not present

## 2022-09-11 DIAGNOSIS — R195 Other fecal abnormalities: Secondary | ICD-10-CM | POA: Diagnosis not present

## 2023-09-09 DIAGNOSIS — N400 Enlarged prostate without lower urinary tract symptoms: Secondary | ICD-10-CM | POA: Diagnosis not present

## 2023-09-09 DIAGNOSIS — I10 Essential (primary) hypertension: Secondary | ICD-10-CM | POA: Diagnosis not present

## 2023-09-09 DIAGNOSIS — Z0000 Encounter for general adult medical examination without abnormal findings: Secondary | ICD-10-CM | POA: Diagnosis not present

## 2023-09-09 DIAGNOSIS — J309 Allergic rhinitis, unspecified: Secondary | ICD-10-CM | POA: Diagnosis not present

## 2023-09-09 DIAGNOSIS — I679 Cerebrovascular disease, unspecified: Secondary | ICD-10-CM | POA: Diagnosis not present

## 2023-09-09 DIAGNOSIS — J449 Chronic obstructive pulmonary disease, unspecified: Secondary | ICD-10-CM | POA: Diagnosis not present

## 2023-09-09 DIAGNOSIS — E785 Hyperlipidemia, unspecified: Secondary | ICD-10-CM | POA: Diagnosis not present

## 2023-10-23 DIAGNOSIS — R197 Diarrhea, unspecified: Secondary | ICD-10-CM | POA: Diagnosis not present

## 2023-10-23 DIAGNOSIS — Z6822 Body mass index (BMI) 22.0-22.9, adult: Secondary | ICD-10-CM | POA: Diagnosis not present

## 2023-10-24 DIAGNOSIS — R197 Diarrhea, unspecified: Secondary | ICD-10-CM | POA: Diagnosis not present

## 2023-11-06 ENCOUNTER — Other Ambulatory Visit: Payer: Self-pay | Admitting: Gastroenterology

## 2023-11-06 DIAGNOSIS — R197 Diarrhea, unspecified: Secondary | ICD-10-CM | POA: Diagnosis not present

## 2023-11-06 DIAGNOSIS — R109 Unspecified abdominal pain: Secondary | ICD-10-CM

## 2023-11-06 DIAGNOSIS — R634 Abnormal weight loss: Secondary | ICD-10-CM

## 2023-11-07 DIAGNOSIS — R109 Unspecified abdominal pain: Secondary | ICD-10-CM | POA: Diagnosis not present

## 2023-11-07 DIAGNOSIS — R634 Abnormal weight loss: Secondary | ICD-10-CM | POA: Diagnosis not present

## 2023-11-07 DIAGNOSIS — R197 Diarrhea, unspecified: Secondary | ICD-10-CM | POA: Diagnosis not present

## 2023-11-12 ENCOUNTER — Ambulatory Visit
Admission: RE | Admit: 2023-11-12 | Discharge: 2023-11-12 | Disposition: A | Discharge status: Home / Self Care | Source: Ambulatory Visit | Attending: Gastroenterology | Admitting: Gastroenterology

## 2023-11-12 DIAGNOSIS — I7143 Infrarenal abdominal aortic aneurysm, without rupture: Secondary | ICD-10-CM | POA: Diagnosis not present

## 2023-11-12 DIAGNOSIS — D734 Cyst of spleen: Secondary | ICD-10-CM | POA: Diagnosis not present

## 2023-11-12 DIAGNOSIS — R634 Abnormal weight loss: Secondary | ICD-10-CM

## 2023-11-12 DIAGNOSIS — K5730 Diverticulosis of large intestine without perforation or abscess without bleeding: Secondary | ICD-10-CM | POA: Diagnosis not present

## 2023-11-12 DIAGNOSIS — N400 Enlarged prostate without lower urinary tract symptoms: Secondary | ICD-10-CM | POA: Diagnosis not present

## 2023-11-12 DIAGNOSIS — R109 Unspecified abdominal pain: Secondary | ICD-10-CM

## 2023-11-12 MED ORDER — IOPAMIDOL (ISOVUE-300) INJECTION 61%
100.0000 mL | Freq: Once | INTRAVENOUS | Status: AC | PRN
  Administered 2023-11-12: 100 mL via INTRAVENOUS

## 2024-01-16 DIAGNOSIS — J449 Chronic obstructive pulmonary disease, unspecified: Secondary | ICD-10-CM | POA: Diagnosis not present

## 2024-01-16 DIAGNOSIS — R634 Abnormal weight loss: Secondary | ICD-10-CM | POA: Diagnosis not present

## 2024-01-16 DIAGNOSIS — Z6820 Body mass index (BMI) 20.0-20.9, adult: Secondary | ICD-10-CM | POA: Diagnosis not present

## 2024-01-19 ENCOUNTER — Other Ambulatory Visit: Payer: Self-pay | Admitting: Family Medicine

## 2024-01-19 DIAGNOSIS — J449 Chronic obstructive pulmonary disease, unspecified: Secondary | ICD-10-CM

## 2024-01-27 ENCOUNTER — Encounter: Payer: Self-pay | Admitting: Family Medicine

## 2024-01-29 ENCOUNTER — Ambulatory Visit
Admission: RE | Admit: 2024-01-29 | Discharge: 2024-01-29 | Disposition: A | Discharge status: Home / Self Care | Source: Ambulatory Visit | Attending: Family Medicine | Admitting: Family Medicine

## 2024-01-29 DIAGNOSIS — J449 Chronic obstructive pulmonary disease, unspecified: Secondary | ICD-10-CM

## 2024-01-29 DIAGNOSIS — J90 Pleural effusion, not elsewhere classified: Secondary | ICD-10-CM | POA: Diagnosis not present

## 2024-01-29 DIAGNOSIS — J432 Centrilobular emphysema: Secondary | ICD-10-CM | POA: Diagnosis not present

## 2024-02-02 ENCOUNTER — Telehealth: Payer: Self-pay

## 2024-02-02 NOTE — Telephone Encounter (Signed)
 Received call from patient primary care through [facility],Ct in epic mass in U[facility] measuring 10x8.9cm,Referring provider Lamar Ng MD,history of emphysema,never seen pulmonary,office is confused whether or not they should please a referral to pulmonary or cardiothoracic surgery/oncology ,will route to Dr.Byrum and Lauraine to advise on this.

## 2024-02-02 NOTE — Telephone Encounter (Signed)
 Received message regarding this call. He needs to be seen by pulmonary - we will arrange asap

## 2024-02-02 NOTE — Telephone Encounter (Signed)
 Called and spoke with patient wife to schedule urgent appointment with doc to schedule bronch.

## 2024-02-03 ENCOUNTER — Other Ambulatory Visit (HOSPITAL_COMMUNITY): Payer: Self-pay | Admitting: Family Medicine

## 2024-02-03 DIAGNOSIS — R918 Other nonspecific abnormal finding of lung field: Secondary | ICD-10-CM

## 2024-02-17 ENCOUNTER — Telehealth: Payer: Self-pay | Admitting: Acute Care

## 2024-02-17 ENCOUNTER — Encounter: Payer: Self-pay | Admitting: Acute Care

## 2024-02-17 ENCOUNTER — Ambulatory Visit: Admitting: Acute Care

## 2024-02-17 VITALS — BP 112/67 | HR 72 | Temp 98.5°F | Ht 70.0 in | Wt 143.8 lb

## 2024-02-17 DIAGNOSIS — R9389 Abnormal findings on diagnostic imaging of other specified body structures: Secondary | ICD-10-CM

## 2024-02-17 DIAGNOSIS — R590 Localized enlarged lymph nodes: Secondary | ICD-10-CM | POA: Diagnosis not present

## 2024-02-17 DIAGNOSIS — J90 Pleural effusion, not elsewhere classified: Secondary | ICD-10-CM | POA: Diagnosis not present

## 2024-02-17 DIAGNOSIS — R918 Other nonspecific abnormal finding of lung field: Secondary | ICD-10-CM

## 2024-02-17 DIAGNOSIS — Z87891 Personal history of nicotine dependence: Secondary | ICD-10-CM

## 2024-02-17 DIAGNOSIS — R911 Solitary pulmonary nodule: Secondary | ICD-10-CM

## 2024-02-17 DIAGNOSIS — R634 Abnormal weight loss: Secondary | ICD-10-CM

## 2024-02-17 DIAGNOSIS — J449 Chronic obstructive pulmonary disease, unspecified: Secondary | ICD-10-CM

## 2024-02-17 NOTE — Patient Instructions (Addendum)
 It is good to meet you today. We have reviewed the CT scan together and we have discussed the left upper lobe mass. Best next step is for biopsy to determine definitively what this is. I have placed an order for a bronchoscopy with biopsies.  We have discussed the procedure in detail.  We have reviewed the risks and benefits of the procedure. These include bleeding, infection, puncture of the lung, and adverse reaction to anesthesia. You have agreed to proceed with biopsy to evaluate the left upper lobe nodule and lymph nodes.. Your procedure will be done by Dr. Zaida. You will receive a letter today with date time and information pertaining to the procedure. You will need someone to drive you to the procedure, stay with you during the procedure, and stay with you after the procedure. You will also need someone to stay with you for 24 hours after anesthesia to ensure you have cleared and are doing well. You will follow-up with me 1 week after the procedure to review the results and to ensure you are doing well. Call if you need us  prior to the procedure or if you have any questions at all. Please contact office for sooner follow up if symptoms do not improve or worsen or seek emergency care

## 2024-02-17 NOTE — Telephone Encounter (Signed)
 Letter given by the nurse will send to [facility] to do serbia

## 2024-02-17 NOTE — Telephone Encounter (Signed)
 Please schedule the following:  Provider performing procedure: Hattar Diagnosis:  Lung mass Which side if for nodule / mass?  Left Procedure:  navigational bronchoscopy with biopsy and EBUS  Has patient been spoken to by Provider and given informed consent?  Yes, By Lauraine Lites NP on 02/17/2024 Anesthesia:  General Do you need Fluro?  Yes Duration of procedure:  1.5 hours Date: 02/26/2024 Alternate Date:  03/01/2024 ETTER Chris)  Time:  Third case of the day Location:   Endo Does patient have OSA?  No DM?  NO Or Latex allergy?  No Medication Restriction/ Anticoagulate/Antiplatelet:  Plavix , will need to hold x 5 days prior to procedure Pre-op Labs Ordered:determined by Anesthesia Imaging request:  Recent CT 01/29/2024 and PET  (If, SuperDimension CT Chest, please have STAT courier sent to ENDO)

## 2024-02-17 NOTE — H&P (View-Only) (Signed)
 History of Present Illness Jacob Mckenzie is a 78 y.o. male former smoker( Quit 2016 with a 110 pack year smoking history) referred by Dr. Frederik for a 10 x 8.9 cm large mass of the medial left upper lobe. He will be followed by Dr. Zaida.   02/17/2024 Discussed the use of AI scribe software for clinical note transcription with the patient, who gave verbal consent to proceed.  History of Present Illness Jacob Mckenzie is an 78 year old male with COPD who presents with a lung mass requiring biopsy. He is accompanied by his wife. He was referred by Dr. Frederik.  A lung mass was identified during evaluations at [facility] and [facility]. The mass is large, measuring approximately 10 by 9 centimeters, located on the left side, and involves about half of the left lung.  He has experienced significant weight loss, dropping from 181 pounds in April to 143 pounds currently. The weight loss began in late May, coinciding with severe diarrhea. A CT scan performed by a GI doctor revealed lesions in the intestines and spleen, but no definitive cause for the diarrhea was identified. Imodium has been effective in controlling the diarrhea.  We have reviewed the CT chest. I explained that the best next step moving forward is to biopsy the mass to get a definitive tissue diagnosis, then we can complete PET scan and MR Brain if this is indeed a cancer to complete staging..   His past medical history includes COPD, diagnosed following strokes in 2016. He is currently on the generic form of Plavix .  No coughing up blood.  We discussed risks of bronchoscopy with biopsy  to include bleeding, infection, pneumothorax, and adverse reaction to anesthesia.  Patient has agreed to the proceed with procedure after informed consent.  He understands he will follow-up with me in the office approximately 1 week after biopsy has been completed to review cytology  results and ensure he is done well     Test Results: CT Chest  01/31/2024  Large mass of the medial left upper lobe measuring 10.0 x 8.9 cm, with a large interface to the mediastinum, consistent with primary bronchogenic malignancy. 2. Interlobular septal thickening and ground-glass throughout the left upper lobe and lingula, highly suspicious for lymphangitic carcinomatosis. 3. Moderate left pleural effusion and associated atelectasis or consolidation. 4. Enlarged mediastinal lymph nodes, consistent with nodal metastatic disease. 5. Left adrenal nodule measuring 1.9 x 1.8 cm, nonspecific although suspicious for adrenal metastasis. 6. Emphysema and diffuse bilateral bronchial wall thickening. 7. Coronary artery disease.      Latest Ref Rng & Units 06/17/2015    2:37 AM 06/14/2015    9:04 PM  CBC  WBC 4.0 - 10.5 K/uL 12.7  9.3   Hemoglobin 13.0 - 17.0 g/dL 87.0  84.2   Hematocrit 39.0 - 52.0 % 39.2  48.3   Platelets 150 - 400 K/uL 233  281        Latest Ref Rng & Units 06/17/2015    2:37 AM 06/16/2015    7:00 PM 06/14/2015    9:04 PM  BMP  Glucose 65 - 99 mg/dL 895  846  850   BUN 6 - 20 mg/dL 11  10  17    Creatinine 0.61 - 1.24 mg/dL 9.36  9.23  9.16   Sodium 135 - 145 mmol/L 142  140  144   Potassium 3.5 - 5.1 mmol/L 4.3  4.3  4.3   Chloride 101 - 111 mmol/L 109  108  104   CO2 22 - 32 mmol/L 26  24  29    Calcium  8.9 - 10.3 mg/dL 9.0  9.4  89.5     BNP    Component Value Date/Time   BNP 175.9 (H) 06/14/2015 2236    ProBNP No results found for: PROBNP  PFT No results found for: FEV1PRE, FEV1POST, FVCPRE, FVCPOST, TLC, DLCOUNC, PREFEV1FVCRT, PSTFEV1FVCRT  CT Chest High Resolution Result Date: 01/31/2024 CLINICAL DATA:  COPD, shortness of breath, former smoker * Tracking Code: BO * EXAM: CT CHEST WITHOUT CONTRAST TECHNIQUE: Multidetector CT imaging of the chest was performed following the standard protocol without intravenous contrast. High resolution imaging of the lungs, as well as inspiratory and expiratory  imaging, was performed. RADIATION DOSE REDUCTION: This exam was performed according to the departmental dose-optimization program which includes automated exposure control, adjustment of the mA and/or kV according to patient size and/or use of iterative reconstruction technique. COMPARISON:  None Available. FINDINGS: Cardiovascular: Severe aortic atherosclerosis. Normal heart size. Three-vessel coronary artery calcifications. No pericardial effusion. Mediastinum/Nodes: Enlarged mediastinal lymph nodes, in the AP window measuring up to 1.8 x 0.9 cm (series 2, image 51). Thyroid gland, trachea, and esophagus demonstrate no significant findings. Lungs/Pleura: Mild centrilobular emphysema. Mild diffuse bilateral bronchial wall thickening. Large mass of the medial left upper lobe measuring 10.0 x 8.9 cm, with a large interface to the mediastinum (series 2, image 58). Interlobular septal thickening and ground-glass throughout the left upper lobe and lingula (series 4, image 74). Moderate left pleural effusion and associated atelectasis or consolidation. Upper Abdomen: No acute abnormality. Left adrenal nodule measuring 1.9 x 1.8 cm (series 2, image 136). Musculoskeletal: No chest wall abnormality. No acute osseous findings. IMPRESSION: 1. Large mass of the medial left upper lobe measuring 10.0 x 8.9 cm, with a large interface to the mediastinum, consistent with primary bronchogenic malignancy. 2. Interlobular septal thickening and ground-glass throughout the left upper lobe and lingula, highly suspicious for lymphangitic carcinomatosis. 3. Moderate left pleural effusion and associated atelectasis or consolidation. 4. Enlarged mediastinal lymph nodes, consistent with nodal metastatic disease. 5. Left adrenal nodule measuring 1.9 x 1.8 cm, nonspecific although suspicious for adrenal metastasis. 6. Emphysema and diffuse bilateral bronchial wall thickening. 7. Coronary artery disease. These results will be called to the  ordering clinician or representative by the Radiologist Assistant, and communication documented in the PACS or [employer]. Aortic Atherosclerosis (ICD10-I70.0) and Emphysema (ICD10-J43.9). Electronically Signed   By: Marolyn JONETTA Jaksch M.D.   On: 01/31/2024 18:03     Past medical hx Past Medical History:  Diagnosis Date   COPD (chronic obstructive pulmonary disease) (HCC)    Stroke (HCC)    Tobacco abuse      Social History   Tobacco Use   Smoking status: Former    Current packs/day: 0.00    Average packs/day: 2.0 packs/day for 55.0 years (110.0 ttl pk-yrs)    Types: Cigarettes    Start date: 06/12/1960    Quit date: 06/13/2015    Years since quitting: 8.6    Passive exposure: Past   Smokeless tobacco: Never  Substance Use Topics   Alcohol use: Yes    Alcohol/week: 2.0 standard drinks of alcohol    Types: 1 Cans of beer, 1 Shots of liquor per week    Comment: occasionally   Drug use: No    Mr.Meisenheimer reports that he quit smoking about 8 years ago. His smoking use included cigarettes. He started smoking about 63 years ago. He has a  110 pack-year smoking history. He has been exposed to tobacco smoke. He has never used smokeless tobacco. He reports current alcohol use of about 2.0 standard drinks of alcohol per week. He reports that he does not use drugs.  Tobacco Cessation: Counseling given: Not Answered Former smoker quit 2017 with a 110-pack-year smoking history  Past surgical hx, Family hx, Social hx all reviewed.  Current Outpatient Medications on File Prior to Visit  Medication Sig   albuterol  (PROVENTIL  HFA;VENTOLIN  HFA) 108 (90 Base) MCG/ACT inhaler Inhale 1-2 puffs into the lungs every 4 (four) hours as needed for wheezing or shortness of breath (ICD10code  J44.9).   atorvastatin  (LIPITOR) 40 MG tablet Take 1 tablet (40 mg total) by mouth at bedtime.   clopidogrel  (PLAVIX ) 75 MG tablet Take 1 tablet (75 mg total) by mouth daily.   metoprolol  tartrate (LOPRESSOR ) 25  MG tablet Take 1 tablet (25 mg total) by mouth 2 (two) times daily.   Multiple Vitamins-Minerals (MULTIVITAMIN & MINERAL PO) Take 1 tablet by mouth daily.   No current facility-administered medications on file prior to visit.     No Known Allergies  Review Of Systems:  Constitutional:   +  weight loss, No night sweats,  Fevers, chills, + fatigue, or  lassitude.  HEENT:   No headaches,  Difficulty swallowing,  Tooth/dental problems, or  Sore throat,                No sneezing, itching, ear ache, nasal congestion, post nasal drip,   CV:  No chest pain,  Orthopnea, PND, swelling in lower extremities, anasarca, dizziness, palpitations, syncope.   GI  No heartburn, indigestion, abdominal pain, nausea, vomiting, +diarrhea, + change in bowel habits, loss of appetite, bloody stools.   Resp: + shortness of breath with exertion less at rest.  No excess mucus, no productive cough,  No non-productive cough,  No coughing up of blood.  No change in color of mucus.  No wheezing.  No chest wall deformity  Skin: no rash or lesions.  GU: no dysuria, change in color of urine, no urgency or frequency.  No flank pain, no hematuria   MS:  No joint pain or swelling.  + decreased range of motion.  No back pain.  Psych:  No change in mood or affect. No depression or anxiety.  No memory loss.   Vital Signs BP 112/67   Pulse 72   Temp 98.5 F (36.9 C) (Oral)   Ht 5' 10 (1.778 m)   Wt 143 lb 12.8 oz (65.2 kg)   SpO2 93%   BMI 20.63 kg/m    Physical Exam:  General- No distress,  A&Ox3, pleasant ENT: No sinus tenderness, TM clear, pale nasal mucosa, no oral exudate,no post nasal drip, no LAN, hard of hearing Cardiac: S1, S2, regular rate and rhythm, no murmur Chest: + wheeze/ No rales/ dullness; no accessory muscle use, no nasal flaring, no sternal retractions Abd.: Soft Non-tender, nondistended, bowel sounds positive,Body mass index is 20.63 kg/m.  Ext: No clubbing cyanosis, edema, no obvious  deformities Neuro: Physical deconditioning and frailty, moving all extremities x 4, alert and oriented x 3 Skin: No rashes, warm and dry, no obvious skin lesions Psych: normal mood and behavior    Assessment & Plan Left lung mass with mediastinal lymphadenopathy, pending biopsy Large left lung mass 10x9 cm with mediastinal lymphadenopathy, highly suspicious for malignancy. Significant weight loss since May raises concern for cancer. - Schedule bronchoscopy with biopsy on October 2nd. -  Hold Plavix  for 5 days prior to procedure. - Arrange PET scan and MRI of the brain pre-biopsy if possible for staging. - Discuss potential for immune therapy based on biopsy results.  Left pleural effusion Presence of left pleural effusion on imaging, likely related to the lung mass. - No immediate intervention planned until after biopsy results are available, unless patient becomes more  dyspneic.  Unintentional weight loss Significant unintentional weight loss of approximately 40 pounds since May, coinciding with onset of diarrhea. Concerning for malignancy, particularly in the context of the lung mass.  Previous GI evaluation did not reveal a cause for diarrhea. Plan Follow up with GI once navigational bronchoscopy with biopsies has been completed and tissue diagnosis is confirmed  I spent 35 minutes dedicated to the care of this patient on the date of this encounter to include pre-visit review of records, face-to-face time with the patient discussing conditions above, post visit ordering of testing, clinical documentation with the electronic health record, making appropriate referrals as documented, and communicating necessary information to the patient's healthcare team.       Lauraine JULIANNA Lites, NP 02/17/2024  3:23 PM

## 2024-02-17 NOTE — Progress Notes (Signed)
 History of Present Illness Jacob Mckenzie is a 78 y.o. male former smoker( Quit 2016 with a 110 pack year smoking history) referred by Dr. Frederik for a 10 x 8.9 cm large mass of the medial left upper lobe. He will be followed by Dr. Zaida.   02/17/2024 Discussed the use of AI scribe software for clinical note transcription with the patient, who gave verbal consent to proceed.  History of Present Illness Jacob Mckenzie is an 78 year old male with COPD who presents with a lung mass requiring biopsy. He is accompanied by his wife. He was referred by Dr. Frederik.  A lung mass was identified during evaluations at [facility] and [facility]. The mass is large, measuring approximately 10 by 9 centimeters, located on the left side, and involves about half of the left lung.  He has experienced significant weight loss, dropping from 181 pounds in April to 143 pounds currently. The weight loss began in late May, coinciding with severe diarrhea. A CT scan performed by a GI doctor revealed lesions in the intestines and spleen, but no definitive cause for the diarrhea was identified. Imodium has been effective in controlling the diarrhea.  We have reviewed the CT chest. I explained that the best next step moving forward is to biopsy the mass to get a definitive tissue diagnosis, then we can complete PET scan and MR Brain if this is indeed a cancer to complete staging..   His past medical history includes COPD, diagnosed following strokes in 2016. He is currently on the generic form of Plavix .  No coughing up blood.  We discussed risks of bronchoscopy with biopsy  to include bleeding, infection, pneumothorax, and adverse reaction to anesthesia.  Patient has agreed to the proceed with procedure after informed consent.  He understands he will follow-up with me in the office approximately 1 week after biopsy has been completed to review cytology  results and ensure he is done well     Test Results: CT Chest  01/31/2024  Large mass of the medial left upper lobe measuring 10.0 x 8.9 cm, with a large interface to the mediastinum, consistent with primary bronchogenic malignancy. 2. Interlobular septal thickening and ground-glass throughout the left upper lobe and lingula, highly suspicious for lymphangitic carcinomatosis. 3. Moderate left pleural effusion and associated atelectasis or consolidation. 4. Enlarged mediastinal lymph nodes, consistent with nodal metastatic disease. 5. Left adrenal nodule measuring 1.9 x 1.8 cm, nonspecific although suspicious for adrenal metastasis. 6. Emphysema and diffuse bilateral bronchial wall thickening. 7. Coronary artery disease.      Latest Ref Rng & Units 06/17/2015    2:37 AM 06/14/2015    9:04 PM  CBC  WBC 4.0 - 10.5 K/uL 12.7  9.3   Hemoglobin 13.0 - 17.0 g/dL 87.0  84.2   Hematocrit 39.0 - 52.0 % 39.2  48.3   Platelets 150 - 400 K/uL 233  281        Latest Ref Rng & Units 06/17/2015    2:37 AM 06/16/2015    7:00 PM 06/14/2015    9:04 PM  BMP  Glucose 65 - 99 mg/dL 895  846  850   BUN 6 - 20 mg/dL 11  10  17    Creatinine 0.61 - 1.24 mg/dL 9.36  9.23  9.16   Sodium 135 - 145 mmol/L 142  140  144   Potassium 3.5 - 5.1 mmol/L 4.3  4.3  4.3   Chloride 101 - 111 mmol/L 109  108  104   CO2 22 - 32 mmol/L 26  24  29    Calcium  8.9 - 10.3 mg/dL 9.0  9.4  89.5     BNP    Component Value Date/Time   BNP 175.9 (H) 06/14/2015 2236    ProBNP No results found for: PROBNP  PFT No results found for: FEV1PRE, FEV1POST, FVCPRE, FVCPOST, TLC, DLCOUNC, PREFEV1FVCRT, PSTFEV1FVCRT  CT Chest High Resolution Result Date: 01/31/2024 CLINICAL DATA:  COPD, shortness of breath, former smoker * Tracking Code: BO * EXAM: CT CHEST WITHOUT CONTRAST TECHNIQUE: Multidetector CT imaging of the chest was performed following the standard protocol without intravenous contrast. High resolution imaging of the lungs, as well as inspiratory and expiratory  imaging, was performed. RADIATION DOSE REDUCTION: This exam was performed according to the departmental dose-optimization program which includes automated exposure control, adjustment of the mA and/or kV according to patient size and/or use of iterative reconstruction technique. COMPARISON:  None Available. FINDINGS: Cardiovascular: Severe aortic atherosclerosis. Normal heart size. Three-vessel coronary artery calcifications. No pericardial effusion. Mediastinum/Nodes: Enlarged mediastinal lymph nodes, in the AP window measuring up to 1.8 x 0.9 cm (series 2, image 51). Thyroid gland, trachea, and esophagus demonstrate no significant findings. Lungs/Pleura: Mild centrilobular emphysema. Mild diffuse bilateral bronchial wall thickening. Large mass of the medial left upper lobe measuring 10.0 x 8.9 cm, with a large interface to the mediastinum (series 2, image 58). Interlobular septal thickening and ground-glass throughout the left upper lobe and lingula (series 4, image 74). Moderate left pleural effusion and associated atelectasis or consolidation. Upper Abdomen: No acute abnormality. Left adrenal nodule measuring 1.9 x 1.8 cm (series 2, image 136). Musculoskeletal: No chest wall abnormality. No acute osseous findings. IMPRESSION: 1. Large mass of the medial left upper lobe measuring 10.0 x 8.9 cm, with a large interface to the mediastinum, consistent with primary bronchogenic malignancy. 2. Interlobular septal thickening and ground-glass throughout the left upper lobe and lingula, highly suspicious for lymphangitic carcinomatosis. 3. Moderate left pleural effusion and associated atelectasis or consolidation. 4. Enlarged mediastinal lymph nodes, consistent with nodal metastatic disease. 5. Left adrenal nodule measuring 1.9 x 1.8 cm, nonspecific although suspicious for adrenal metastasis. 6. Emphysema and diffuse bilateral bronchial wall thickening. 7. Coronary artery disease. These results will be called to the  ordering clinician or representative by the Radiologist Assistant, and communication documented in the PACS or [employer]. Aortic Atherosclerosis (ICD10-I70.0) and Emphysema (ICD10-J43.9). Electronically Signed   By: Marolyn JONETTA Jaksch M.D.   On: 01/31/2024 18:03     Past medical hx Past Medical History:  Diagnosis Date   COPD (chronic obstructive pulmonary disease) (HCC)    Stroke (HCC)    Tobacco abuse      Social History   Tobacco Use   Smoking status: Former    Current packs/day: 0.00    Average packs/day: 2.0 packs/day for 55.0 years (110.0 ttl pk-yrs)    Types: Cigarettes    Start date: 06/12/1960    Quit date: 06/13/2015    Years since quitting: 8.6    Passive exposure: Past   Smokeless tobacco: Never  Substance Use Topics   Alcohol use: Yes    Alcohol/week: 2.0 standard drinks of alcohol    Types: 1 Cans of beer, 1 Shots of liquor per week    Comment: occasionally   Drug use: No    Mr.Seider reports that he quit smoking about 8 years ago. His smoking use included cigarettes. He started smoking about 63 years ago. He has a  110 pack-year smoking history. He has been exposed to tobacco smoke. He has never used smokeless tobacco. He reports current alcohol use of about 2.0 standard drinks of alcohol per week. He reports that he does not use drugs.  Tobacco Cessation: Counseling given: Not Answered Former smoker quit 2017 with a 110-pack-year smoking history  Past surgical hx, Family hx, Social hx all reviewed.  Current Outpatient Medications on File Prior to Visit  Medication Sig   albuterol  (PROVENTIL  HFA;VENTOLIN  HFA) 108 (90 Base) MCG/ACT inhaler Inhale 1-2 puffs into the lungs every 4 (four) hours as needed for wheezing or shortness of breath (ICD10code  J44.9).   atorvastatin  (LIPITOR) 40 MG tablet Take 1 tablet (40 mg total) by mouth at bedtime.   clopidogrel  (PLAVIX ) 75 MG tablet Take 1 tablet (75 mg total) by mouth daily.   metoprolol  tartrate (LOPRESSOR ) 25  MG tablet Take 1 tablet (25 mg total) by mouth 2 (two) times daily.   Multiple Vitamins-Minerals (MULTIVITAMIN & MINERAL PO) Take 1 tablet by mouth daily.   No current facility-administered medications on file prior to visit.     No Known Allergies  Review Of Systems:  Constitutional:   +  weight loss, No night sweats,  Fevers, chills, + fatigue, or  lassitude.  HEENT:   No headaches,  Difficulty swallowing,  Tooth/dental problems, or  Sore throat,                No sneezing, itching, ear ache, nasal congestion, post nasal drip,   CV:  No chest pain,  Orthopnea, PND, swelling in lower extremities, anasarca, dizziness, palpitations, syncope.   GI  No heartburn, indigestion, abdominal pain, nausea, vomiting, +diarrhea, + change in bowel habits, loss of appetite, bloody stools.   Resp: + shortness of breath with exertion less at rest.  No excess mucus, no productive cough,  No non-productive cough,  No coughing up of blood.  No change in color of mucus.  No wheezing.  No chest wall deformity  Skin: no rash or lesions.  GU: no dysuria, change in color of urine, no urgency or frequency.  No flank pain, no hematuria   MS:  No joint pain or swelling.  + decreased range of motion.  No back pain.  Psych:  No change in mood or affect. No depression or anxiety.  No memory loss.   Vital Signs BP 112/67   Pulse 72   Temp 98.5 F (36.9 C) (Oral)   Ht 5' 10 (1.778 m)   Wt 143 lb 12.8 oz (65.2 kg)   SpO2 93%   BMI 20.63 kg/m    Physical Exam:  General- No distress,  A&Ox3, pleasant ENT: No sinus tenderness, TM clear, pale nasal mucosa, no oral exudate,no post nasal drip, no LAN, hard of hearing Cardiac: S1, S2, regular rate and rhythm, no murmur Chest: + wheeze/ No rales/ dullness; no accessory muscle use, no nasal flaring, no sternal retractions Abd.: Soft Non-tender, nondistended, bowel sounds positive,Body mass index is 20.63 kg/m.  Ext: No clubbing cyanosis, edema, no obvious  deformities Neuro: Physical deconditioning and frailty, moving all extremities x 4, alert and oriented x 3 Skin: No rashes, warm and dry, no obvious skin lesions Psych: normal mood and behavior    Assessment & Plan Left lung mass with mediastinal lymphadenopathy, pending biopsy Large left lung mass 10x9 cm with mediastinal lymphadenopathy, highly suspicious for malignancy. Significant weight loss since May raises concern for cancer. - Schedule bronchoscopy with biopsy on October 2nd. -  Hold Plavix  for 5 days prior to procedure. - Arrange PET scan and MRI of the brain pre-biopsy if possible for staging. - Discuss potential for immune therapy based on biopsy results.  Left pleural effusion Presence of left pleural effusion on imaging, likely related to the lung mass. - No immediate intervention planned until after biopsy results are available, unless patient becomes more  dyspneic.  Unintentional weight loss Significant unintentional weight loss of approximately 40 pounds since May, coinciding with onset of diarrhea. Concerning for malignancy, particularly in the context of the lung mass.  Previous GI evaluation did not reveal a cause for diarrhea. Plan Follow up with GI once navigational bronchoscopy with biopsies has been completed and tissue diagnosis is confirmed  I spent 35 minutes dedicated to the care of this patient on the date of this encounter to include pre-visit review of records, face-to-face time with the patient discussing conditions above, post visit ordering of testing, clinical documentation with the electronic health record, making appropriate referrals as documented, and communicating necessary information to the patient's healthcare team.       Lauraine JULIANNA Lites, NP 02/17/2024  3:23 PM

## 2024-02-22 ENCOUNTER — Encounter: Payer: Self-pay | Admitting: Acute Care

## 2024-02-23 ENCOUNTER — Encounter (HOSPITAL_COMMUNITY)
Admission: RE | Admit: 2024-02-23 | Discharge: 2024-02-23 | Disposition: A | Discharge status: Home / Self Care | Source: Ambulatory Visit | Attending: Acute Care | Admitting: Acute Care

## 2024-02-23 DIAGNOSIS — R911 Solitary pulmonary nodule: Secondary | ICD-10-CM | POA: Diagnosis not present

## 2024-02-23 DIAGNOSIS — R918 Other nonspecific abnormal finding of lung field: Secondary | ICD-10-CM | POA: Diagnosis not present

## 2024-02-23 LAB — GLUCOSE, CAPILLARY: Glucose-Capillary: 82 mg/dL (ref 70–99)

## 2024-02-23 MED ORDER — FLUDEOXYGLUCOSE F - 18 (FDG) INJECTION
7.1000 | Freq: Once | INTRAVENOUS | Status: AC
  Administered 2024-02-23: 7.1 via INTRAVENOUS

## 2024-02-25 ENCOUNTER — Encounter (HOSPITAL_COMMUNITY): Payer: Self-pay

## 2024-02-25 ENCOUNTER — Other Ambulatory Visit: Payer: Self-pay

## 2024-02-25 NOTE — Progress Notes (Addendum)
 Anesthesia Chart Review: Jacob Mckenzie  Case: [id number] Date/Time: 02/26/24 1130   Procedure: VIDEO BRONCHOSCOPY WITH ENDOBRONCHIAL NAVIGATION (Left)   Anesthesia type: General   Diagnosis: Lung mass [R91.8]   Pre-op diagnosis: lung  mass   Location: [facility] / [facility]   Surgeons: Zaida Zola SAILOR, MD       DISCUSSION: Patient is an 78 year old male scheduled for the above procedure.   History includes former smoker (quit 06/13/2015), COPD, CVA (05/2015). 3.9 cm AAA by 11/12/2023 CT done for evaluation of weight loss and diarrhea.    He continued to have weight loss (~ 30 lbs) despite GI evaluation, dietary changes, and as needed Imodium, so referred for chest CT to evaluate for possible pulmonary malignancy.   01/29/2024 chest CT showed a large 10 cm LUL mass concerning for primary bronchogenic malignancy. There was also a moderate left pleural effusion, enlarged mediastinal lymph nodes, and findings highly suspicious for lymphangitic carcinomatosis. There was a non-specific left adrenal nodule. 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 There was also a hypermetabolic splenic mass and doval metabolic activity of the left enlarged prostate.   Urgent pulmonary referral for bronchoscopy with biopsies for tissue diagnosis.   Last EKG noted is from 2017, so update on arrival as indicated. He had a CBC and CMP on 02/03/2024 through primary care (see LABS below). Pulmonology advised holding Plavix  for 5 days prior to procedure.   Anesthesia team to evaluate on the day of surgery.    VS:  Wt Readings from Last 3 Encounters:  02/17/24 65.2 kg  08/19/16 76.7 kg  02/20/16 72.3 kg   BP Readings from Last 3 Encounters:  02/17/24 112/67  08/19/16 108/66  02/20/16 139/69   Pulse Readings from Last 3 Encounters:  02/17/24 72  08/19/16 (!) 50  02/20/16 (!) 47      PROVIDERS: Frederik Charleston, MD is PCP    LABS: For day of surgery as indicated. Most recent labs noted from [facility] (see Care Everywhere) include: TSH 3.82 on 01/19/2024.  CMP and CBC with differential on 02/03/2024 showed glucose 92, BUN 6, creatinine 0.73, sodium 143, potassium 4.6, calcium  9.7 (corrected 10.23), total bilirubin 0.6, albumin 3.3, protein 5.8, alkaline phosphatase 97, AST 41, ALT 12, WBC 6.8, hemoglobin 13.1, hematocrit 38.5, platelet count 265K.  PSA 6.55 on 09/11/2023.  Fecal pancreatic lactase 205 (normal > 200).     IMAGES: PET Scan 02/23/2024: IMPRESSION: - Large necrotic mass in left upper lobe consistent with malignancy until proven otherwise, stable to prior CT. 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 Suggestion of N2 nodal disease. Left-sided moderate pleural effusion. Recommend tissue sampling if not yet performed. - Hypermetabolic splenic mass consistent with metastasis until proven otherwise. - Left adrenal nodule with mild FDG uptake, indeterminate. - Aneurysmal dilation of abdominal aorta, stable to prior. - Prostatomegaly with focal metabolic activity of left lobe. Recommend correlation PSA levels and dedicated MRI prostate to exclude primary prostate malignancy.  CT Chest High Resolution 01/29/2024: IMPRESSION: 1. Large mass of the medial left upper lobe measuring 10.0 x 8.9 cm, with a large interface to the mediastinum, consistent with primary bronchogenic malignancy. 2. Interlobular septal thickening and ground-glass throughout the left upper lobe and lingula, highly suspicious for lymphangitic carcinomatosis. 3. Moderate left pleural effusion and associated atelectasis or consolidation. 4. Enlarged mediastinal lymph nodes, consistent with nodal  metastatic disease. 5. Left adrenal nodule measuring 1.9 x 1.8 cm,  nonspecific although suspicious for adrenal metastasis. 6. Emphysema and diffuse bilateral bronchial wall thickening. 7. Coronary artery disease.   CT Abd/pelvis 11/12/2023: IMPRESSION: - No acute findings. - Colonic diverticulosis, without radiographic evidence of diverticulitis. - Mildly enlarged prostate. - 3.9 cm infrarenal abdominal aortic aneurysm. Recommend surveillance ultrasound in 3 years. Reference: Journal of Vascular Surgery 67.1 (2018): 2-77. J Am Coll Radiol 2013;10:789-794. - Nonspecific 1.4 cm low-attenuation splenic lesion, likely representing a benign etiology such as a hemangioma. Recommend continued follow-up by CT or MRI in 6 months. This recommendation follows ACR consensus guidelines: White Paper of the ACR Incidental Findings Committee II on Splenic and Nodal Findings. J Am Coll Radiol [phone].  CTA Head/Neck 06/15/2015: IMPRESSION: 1. Acute/subacute infarct within the anterior right lentiform nucleus extending into the centrum semiovale. 2. Remote infarct of the left lentiform nucleus. 3. Moderate atrophy and white matter disease. 4. 4 vessel arch configuration. 5. Atherosclerotic changes at the right carotid bifurcation without significant stenosis. 6. Changes of the left carotid bifurcation suggesting previous endarterectomy. There is no residual or recurrent stenosis. 7. Atherosclerotic changes within the proximal basilar artery with mild narrowing of less than 50%. 8. Moderate medium and distal small vessel disease bilaterally without a significant proximal stenosis or occlusion within the branch vessels. 9. Moderate spondylosis in the cervical spine.   EKG: For day of surgery as indicated.  EKG 06/15/2015: ST at 106 bpm, bigeminy PVCs.   CV: Echo 06/15/2015 (in setting of CVA): Study Conclusions  - Left ventricle: The cavity size was normal. Wall thickness was    increased in a pattern of mild LVH. Systolic function was normal.     The estimated ejection fraction was in the range of 50% to 55%.    Wall motion was normal; there were no regional wall motion    abnormalities. The study is not technically sufficient to allow    evaluation of LV diastolic function.   Impressions:  - Low normal LV systolic function; mild LVH; no significant    valvular abnormality.   Past Medical History:  Diagnosis Date   AAA (abdominal aortic aneurysm)    3.9 cm 10/2023 CT   COPD (chronic obstructive pulmonary disease) (HCC)    Stroke (HCC)    Tobacco abuse     No past surgical history on file.  MEDICATIONS: No current facility-administered medications for this encounter.    albuterol  (PROVENTIL  HFA;VENTOLIN  HFA) 108 (90 Base) MCG/ACT inhaler   atorvastatin  (LIPITOR) 40 MG tablet   clopidogrel  (PLAVIX ) 75 MG tablet   metoprolol  tartrate (LOPRESSOR ) 25 MG tablet   Multiple Vitamins-Minerals (MULTIVITAMIN & MINERAL PO)    Isaiah Ruder, PA-C Surgical Short Stay/Anesthesiology [facility] Phone [phone] [facility] Phone [phone] 02/25/2024 10:19 AM

## 2024-02-25 NOTE — Progress Notes (Signed)
 SDW call  Patient's wife, Beverley was given pre-op instructions over the phone. She verbalized understanding of instructions provided. She denied patient having any SOB, fever, cough or CP.      PCP - Dr. Lamar Ng Cardiologist -  Pulmonary:    PPM/ICD - denies Device Orders - na Rep Notified - na   Chest x-ray - na EKG -  na Stress Test - ECHO - 06/15/2015 Cardiac Cath -   Sleep Study/sleep apnea/CPAP: denies  Non-diabetic  Blood Thinner Instructions: Plavix , last dose 02/19/2024 Aspirin  Instructions:denies   ERAS Protcol - NPO   Anesthesia review: Yes. AAA, stroke, COPD  Your procedure is scheduled on Thursday February 26, 2024  Report to [facility] Main Entrance A at  0900  A.M., then check in with the Admitting office.  Call this number if you have problems the morning of surgery:  [phone]   If you have any questions prior to your surgery date call [phone]: Open Monday-Friday 8am-4pm If you experience any cold or flu symptoms such as cough, fever, chills, shortness of breath, etc. between now and your scheduled surgery, please notify us  at the above number    Remember:  Do not eat or drink after midnight the night before your surgery  Take these medicines the morning of surgery with A SIP OF WATER:  Metoprolol   As needed: albuterol   As of today, STOP taking any Aspirin  (unless otherwise instructed by your surgeon) Aleve, Naproxen, Ibuprofen, Motrin, Advil, Goody's, BC's, all herbal medications, fish oil, and all vitamins.

## 2024-02-25 NOTE — Anesthesia Preprocedure Evaluation (Signed)
 Anesthesia Evaluation  Patient identified by MRN, date of birth, ID band Patient awake    Reviewed: Allergy & Precautions, NPO status , Patient's Chart, lab work & pertinent test results  History of Anesthesia Complications Negative for: history of anesthetic complications  Airway Mallampati: II  TM Distance: >3 FB Neck ROM: Full    Dental no notable dental hx. (+) Edentulous Upper, Edentulous Lower   Pulmonary COPD, former smoker LUL lung mass   breath sounds clear to auscultation+ rhonchi        Cardiovascular hypertension, Pt. on medications + Peripheral Vascular Disease (AAA)  Normal cardiovascular exam Rhythm:Regular Rate:Normal     Neuro/Psych CVA (2017)    GI/Hepatic   Endo/Other    Renal/GU Lab Results      Component                Value               Date                            K                        3.7                 02/26/2024                CO2                      26                  02/26/2024                BUN                      7 (L)               02/26/2024                CREATININE               0.64                02/26/2024                GFRNONAA                 >60                 02/26/2024                CALCIUM                   9.3                 02/26/2024                ALBUMIN                  3.4 (L)             06/16/2015                GLUCOSE                  87                  02/26/2024  negative genitourinary   Musculoskeletal  (+) Arthritis ,    Abdominal   Peds  Hematology Lab Results      Component                Value               Date                      WBC                      6.2                 02/26/2024                HGB                      12.2 (L)            02/26/2024                HCT                      39.6                02/26/2024                MCV                      92.3                02/26/2024                PLT                       213                 02/26/2024              Anesthesia Other Findings   Reproductive/Obstetrics                              Anesthesia Physical Anesthesia Plan  ASA: 3  Anesthesia Plan: General   Post-op Pain Management: Minimal or no pain anticipated and Ofirmev  IV (intra-op)*   Induction: Intravenous  PONV Risk Score and Plan: 3 and Treatment may vary due to age or medical condition and Dexamethasone  Airway Management Planned: Oral ETT  Additional Equipment: None  Intra-op Plan:   Post-operative Plan: Extubation in OR  Informed Consent: I have reviewed the patients History and Physical, chart, labs and discussed the procedure including the risks, benefits and alternatives for the proposed anesthesia with the patient or authorized representative who has indicated his/her understanding and acceptance.     Dental advisory given  Plan Discussed with: CRNA and Surgeon  Anesthesia Plan Comments: (PAT note written 02/25/2024 by Yvana Samonte, PA-C.  )         Anesthesia Quick Evaluation

## 2024-02-26 ENCOUNTER — Encounter (HOSPITAL_COMMUNITY): Payer: Self-pay

## 2024-02-26 ENCOUNTER — Ambulatory Visit (HOSPITAL_COMMUNITY)

## 2024-02-26 ENCOUNTER — Ambulatory Visit (HOSPITAL_COMMUNITY): Payer: Self-pay | Admitting: Vascular Surgery

## 2024-02-26 ENCOUNTER — Encounter (HOSPITAL_COMMUNITY): Admission: RE | Disposition: A | Discharge status: Home / Self Care | Payer: Self-pay | Source: Home / Self Care

## 2024-02-26 ENCOUNTER — Other Ambulatory Visit: Payer: Self-pay

## 2024-02-26 ENCOUNTER — Ambulatory Visit (HOSPITAL_COMMUNITY): Admission: RE | Admit: 2024-02-26 | Discharge: 2024-02-26 | Disposition: A | Discharge status: Home / Self Care

## 2024-02-26 DIAGNOSIS — Z6820 Body mass index (BMI) 20.0-20.9, adult: Secondary | ICD-10-CM | POA: Insufficient documentation

## 2024-02-26 DIAGNOSIS — J449 Chronic obstructive pulmonary disease, unspecified: Secondary | ICD-10-CM | POA: Diagnosis not present

## 2024-02-26 DIAGNOSIS — J432 Centrilobular emphysema: Secondary | ICD-10-CM | POA: Diagnosis not present

## 2024-02-26 DIAGNOSIS — R911 Solitary pulmonary nodule: Secondary | ICD-10-CM

## 2024-02-26 DIAGNOSIS — Z87891 Personal history of nicotine dependence: Secondary | ICD-10-CM | POA: Insufficient documentation

## 2024-02-26 DIAGNOSIS — C3412 Malignant neoplasm of upper lobe, left bronchus or lung: Secondary | ICD-10-CM | POA: Insufficient documentation

## 2024-02-26 DIAGNOSIS — R918 Other nonspecific abnormal finding of lung field: Secondary | ICD-10-CM

## 2024-02-26 DIAGNOSIS — I10 Essential (primary) hypertension: Secondary | ICD-10-CM | POA: Diagnosis not present

## 2024-02-26 DIAGNOSIS — Z7902 Long term (current) use of antithrombotics/antiplatelets: Secondary | ICD-10-CM | POA: Diagnosis not present

## 2024-02-26 DIAGNOSIS — R634 Abnormal weight loss: Secondary | ICD-10-CM | POA: Insufficient documentation

## 2024-02-26 DIAGNOSIS — Z8673 Personal history of transient ischemic attack (TIA), and cerebral infarction without residual deficits: Secondary | ICD-10-CM | POA: Diagnosis not present

## 2024-02-26 DIAGNOSIS — Z48813 Encounter for surgical aftercare following surgery on the respiratory system: Secondary | ICD-10-CM | POA: Diagnosis not present

## 2024-02-26 HISTORY — DX: Unspecified osteoarthritis, unspecified site: M19.90

## 2024-02-26 HISTORY — DX: Abdominal aortic aneurysm, without rupture, unspecified: I71.40

## 2024-02-26 HISTORY — PX: BRONCHIAL NEEDLE ASPIRATION BIOPSY: SHX5106

## 2024-02-26 HISTORY — PX: VIDEO BRONCHOSCOPY WITH ENDOBRONCHIAL ULTRASOUND: SHX6177

## 2024-02-26 LAB — BASIC METABOLIC PANEL WITH GFR
Anion gap: 8 (ref 5–15)
BUN: 7 mg/dL — ABNORMAL LOW (ref 8–23)
CO2: 26 mmol/L (ref 22–32)
Calcium: 9.3 mg/dL (ref 8.9–10.3)
Chloride: 105 mmol/L (ref 98–111)
Creatinine, Ser: 0.64 mg/dL (ref 0.61–1.24)
GFR, Estimated: >60 mL/min (ref >60)
Glucose, Bld: 87 mg/dL (ref 70–99)
Potassium: 3.7 mmol/L (ref 3.5–5.1)
Sodium: 139 mmol/L (ref 135–145)

## 2024-02-26 LAB — CBC
HCT: 39.6 % (ref 39.0–52.0)
Hemoglobin: 12.2 g/dL — ABNORMAL LOW (ref 13.0–17.0)
MCH: 28.4 pg (ref 26.0–34.0)
MCHC: 30.8 g/dL (ref 30.0–36.0)
MCV: 92.3 fL (ref 80.0–100.0)
Platelets: 213 K/uL (ref 150–400)
RBC: 4.29 MIL/uL (ref 4.22–5.81)
RDW: 15.1 % (ref 11.5–15.5)
WBC: 6.2 K/uL (ref 4.0–10.5)
nRBC: 0 % (ref 0.0–0.2)

## 2024-02-26 SURGERY — BRONCHOSCOPY, WITH EBUS
Anesthesia: General

## 2024-02-26 MED ORDER — ALBUTEROL SULFATE (2.5 MG/3ML) 0.083% IN NEBU
INHALATION_SOLUTION | RESPIRATORY_TRACT | Status: AC
  Administered 2024-02-26: 2.5 mg via RESPIRATORY_TRACT
  Filled 2024-02-26: qty 3

## 2024-02-26 MED ORDER — CEFAZOLIN SODIUM-DEXTROSE 2-4 GM/100ML-% IV SOLN
INTRAVENOUS | Status: AC
  Filled 2024-02-26: qty 100

## 2024-02-26 MED ORDER — ALBUTEROL SULFATE (2.5 MG/3ML) 0.083% IN NEBU: 2.5000 mg | INHALATION_SOLUTION | Freq: Once | RESPIRATORY_TRACT | Status: AC

## 2024-02-26 MED ORDER — ROCURONIUM BROMIDE 10 MG/ML (PF) SYRINGE
PREFILLED_SYRINGE | INTRAVENOUS | Status: DC | PRN
  Administered 2024-02-26: 50 mg via INTRAVENOUS

## 2024-02-26 MED ORDER — LIDOCAINE 2% (20 MG/ML) 5 ML SYRINGE
INTRAMUSCULAR | Status: DC | PRN
  Administered 2024-02-26: 30 mg via INTRAVENOUS
  Administered 2024-02-26: 80 mg via INTRAVENOUS

## 2024-02-26 MED ORDER — EPINEPHRINE 1 MG/10ML IV SOSY
PREFILLED_SYRINGE | INTRAVENOUS | Status: AC
  Filled 2024-02-26: qty 10

## 2024-02-26 MED ORDER — PHENYLEPHRINE HCL-NACL 20-0.9 MG/250ML-% IV SOLN
INTRAVENOUS | Status: DC | PRN
  Administered 2024-02-26: 40 ug/min via INTRAVENOUS

## 2024-02-26 MED ORDER — LACTATED RINGERS IV SOLN: INTRAVENOUS | Status: DC | PRN

## 2024-02-26 MED ORDER — DEXAMETHASONE SODIUM PHOSPHATE 10 MG/ML IJ SOLN
INTRAMUSCULAR | Status: DC | PRN
  Administered 2024-02-26: 5 mg via INTRAVENOUS

## 2024-02-26 MED ORDER — SUGAMMADEX SODIUM 200 MG/2ML IV SOLN
INTRAVENOUS | Status: DC | PRN
  Administered 2024-02-26: 300 mg via INTRAVENOUS

## 2024-02-26 MED ORDER — PHENYLEPHRINE 80 MCG/ML (10ML) SYRINGE FOR IV PUSH (FOR BLOOD PRESSURE SUPPORT)
PREFILLED_SYRINGE | INTRAVENOUS | Status: DC | PRN
  Administered 2024-02-26: 160 ug via INTRAVENOUS
  Administered 2024-02-26 (×3): 80 ug via INTRAVENOUS

## 2024-02-26 MED ORDER — PROPOFOL 10 MG/ML IV BOLUS
INTRAVENOUS | Status: DC | PRN
  Administered 2024-02-26: 130 mg via INTRAVENOUS

## 2024-02-26 MED ORDER — LACTATED RINGERS IV SOLN: INTRAVENOUS | Status: DC

## 2024-02-26 MED ORDER — PHENYLEPHRINE HCL-NACL 20-0.9 MG/250ML-% IV SOLN: INTRAVENOUS | Status: DC | PRN

## 2024-02-26 MED ORDER — ONDANSETRON HCL 4 MG/2ML IJ SOLN
INTRAMUSCULAR | Status: DC | PRN
  Administered 2024-02-26: 4 mg via INTRAVENOUS

## 2024-02-26 MED ORDER — METOPROLOL TARTRATE 25 MG PO TABS
25.0000 mg | ORAL_TABLET | Freq: Once | ORAL | Status: AC
  Administered 2024-02-26: 25 mg via ORAL
  Filled 2024-02-26: qty 2

## 2024-02-26 MED ORDER — SODIUM CHLORIDE (PF) 0.9 % IJ SOLN
PREFILLED_SYRINGE | INTRAVENOUS | Status: DC | PRN
  Administered 2024-02-26: 1 mL

## 2024-02-26 NOTE — Anesthesia Postprocedure Evaluation (Signed)
 Anesthesia Post Note  Patient: Jacob Mckenzie  Procedure(s) Performed: BRONCHOSCOPY, WITH EBUS BRONCHOSCOPY, WITH NEEDLE ASPIRATION BIOPSY     Patient location during evaluation: PACU Anesthesia Type: General Level of consciousness: awake and alert Pain management: pain level controlled Vital Signs Assessment: post-procedure vital signs reviewed and stable Respiratory status: spontaneous breathing, nonlabored ventilation, respiratory function stable and patient connected to nasal cannula oxygen Cardiovascular status: blood pressure returned to baseline and stable Postop Assessment: no apparent nausea or vomiting Anesthetic complications: no   No notable events documented.  Last Vitals:  Vitals:   02/26/24 1415 02/26/24 1445  BP: 121/77 124/80  Pulse: 99 96  Resp: (!) 22   Temp: 36.8 C   SpO2: 94% 93%    Last Pain:  Vitals:   02/26/24 1415  TempSrc:   PainSc: 0-No pain   Pain Goal:                   [name]

## 2024-02-26 NOTE — Transfer of Care (Signed)
 Immediate Anesthesia Transfer of Care Note  Patient: Jacob Mckenzie  Procedure(s) Performed: BRONCHOSCOPY, WITH EBUS BRONCHOSCOPY, WITH NEEDLE ASPIRATION BIOPSY  Patient Location: PACU  Anesthesia Type:General  Level of Consciousness: awake and patient cooperative  Airway & Oxygen Therapy: Patient connected to face mask oxygen  Post-op Assessment: Report given to RN, Post -op Vital signs reviewed and stable, and Patient moving all extremities X 4  Post vital signs: Reviewed and stable  Last Vitals:  Vitals Value Taken Time  BP 136/78 02/26/24 13:45  Temp 36.3 C 02/26/24 13:45  Pulse 96 02/26/24 13:50  Resp 34 02/26/24 13:50  SpO2 92 % 02/26/24 13:50  Vitals shown include unfiled device data.  Last Pain:  Vitals:   02/26/24 0925  TempSrc:   PainSc: 0-No pain         Complications: No notable events documented.

## 2024-02-26 NOTE — Interval H&P Note (Signed)
 History and Physical Interval Note:  02/26/2024 12:06 PM  Jacob Mckenzie  has presented today for surgery, with the diagnosis of lung  mass.  The various methods of treatment have been discussed with the patient and family. After consideration of risks, benefits and other options for treatment, the patient has consented to  Procedure(s): VIDEO BRONCHOSCOPY WITH ENDOBRONCHIAL NAVIGATION (Left) as a surgical intervention.  The patient's history has been reviewed, patient examined, no change in status, stable for surgery.  I have reviewed the patient's chart and labs.  Questions were answered to the patient's satisfaction.     [name]

## 2024-02-26 NOTE — Anesthesia Procedure Notes (Addendum)
 Procedure Name: Intubation Date/Time: 02/26/2024 12:36 PM  Performed by: Scherrie Mast, CRNAPre-anesthesia Checklist: Patient identified, Emergency Drugs available, Suction available and Patient being monitored Patient Re-evaluated:Patient Re-evaluated prior to induction Oxygen Delivery Method: Circle System Utilized Preoxygenation: Pre-oxygenation with 100% oxygen Induction Type: IV induction Ventilation: Mask ventilation without difficulty Laryngoscope Size: McGrath and 3 Grade View: Grade I Tube type: Oral Tube size: 8.5 mm Number of attempts: 1 Airway Equipment and Method: Stylet and Oral airway Placement Confirmation: ETT inserted through vocal cords under direct vision, positive ETCO2 and breath sounds checked- equal and bilateral Secured at: 21 cm Tube secured with: Tape Dental Injury: Teeth and Oropharynx as per pre-operative assessment

## 2024-02-26 NOTE — Op Note (Signed)
 Flexible and EBUS Bronchoscopy Procedure Note  Jacob Mckenzie  [id number]  12/27/1945  Date:02/26/24  Time:1:28 PM   Provider Performing:Zabdi Mis N [name]   Procedure: Flexible bronchoscopy and EBUS Bronchoscopy  Indication(s) Left upper lobe mass  Consent Risks of the procedure as well as the alternatives and risks of each were explained to the patient and/or caregiver.  Consent for the procedure was obtained.  Anesthesia General Anesthesia   Time Out Verified patient identification, verified procedure, site/side was marked, verified correct patient position, special equipment/implants available, medications/allergies/relevant history reviewed, required imaging and test results available.   Sterile Technique Usual hand hygiene, masks, gowns, and gloves were used   Procedure Description Diagnostic bronchoscope advanced through endotracheal tube and into airway.  Airways were examined down to subsegmental level with findings noted below.   The diagnostic bronchoscope was then removed and the EBUS bronchoscope was advanced into airway into the left upper lobe and EBUS TBNA performed for slide, cell block.  The EBUS bronchoscope was removed after assuring no active bleeding from biopsy site. Switched needle and performed EBUS on station 7.   Findings: Left upper lobe segment appeared narrowed with hypervascularity noted. No endobronchial mass identified. Switched to EBUS and perfomed FNA from LUL lung mass and from station 7 which appeared enlarged and abnormal. Small station 4R noted.    Complications/Tolerance None; patient tolerated the procedure well. Chest X-ray is needed post procedure.   EBL Minimal   Specimen(s) Left upper lobe FNA Station 7 FNA

## 2024-02-27 ENCOUNTER — Encounter (HOSPITAL_COMMUNITY): Payer: Self-pay

## 2024-03-01 ENCOUNTER — Ambulatory Visit: Payer: Self-pay

## 2024-03-01 LAB — CYTOLOGY - NON PAP

## 2024-03-03 ENCOUNTER — Ambulatory Visit: Admitting: Acute Care

## 2024-03-03 ENCOUNTER — Encounter: Payer: Self-pay | Admitting: Acute Care

## 2024-03-03 ENCOUNTER — Telehealth: Payer: Self-pay | Admitting: Internal Medicine

## 2024-03-03 VITALS — BP 126/73 | HR 111 | Temp 97.6°F | Ht 70.0 in | Wt 139.2 lb

## 2024-03-03 DIAGNOSIS — R0609 Other forms of dyspnea: Secondary | ICD-10-CM | POA: Diagnosis not present

## 2024-03-03 DIAGNOSIS — J069 Acute upper respiratory infection, unspecified: Secondary | ICD-10-CM | POA: Diagnosis not present

## 2024-03-03 DIAGNOSIS — J449 Chronic obstructive pulmonary disease, unspecified: Secondary | ICD-10-CM | POA: Diagnosis not present

## 2024-03-03 DIAGNOSIS — C3412 Malignant neoplasm of upper lobe, left bronchus or lung: Secondary | ICD-10-CM | POA: Diagnosis not present

## 2024-03-03 DIAGNOSIS — C3490 Malignant neoplasm of unspecified part of unspecified bronchus or lung: Secondary | ICD-10-CM

## 2024-03-03 MED ORDER — DOXYCYCLINE HYCLATE 100 MG PO TABS: 100.0000 mg | ORAL_TABLET | Freq: Two times a day (BID) | ORAL | 0 refills | Status: AC

## 2024-03-03 MED ORDER — ALBUTEROL SULFATE (2.5 MG/3ML) 0.083% IN NEBU
2.5000 mg | INHALATION_SOLUTION | Freq: Once | RESPIRATORY_TRACT | Status: AC
  Administered 2024-03-03: 2.5 mg via RESPIRATORY_TRACT

## 2024-03-03 MED ORDER — ALBUTEROL SULFATE (2.5 MG/3ML) 0.083% IN NEBU: 2.5000 mg | INHALATION_SOLUTION | Freq: Four times a day (QID) | RESPIRATORY_TRACT | 12 refills | Status: AC | PRN

## 2024-03-03 NOTE — Patient Instructions (Addendum)
 It is good to see you today. I am glad you have done well since the procedure.  The biopsy shows small cell lung cancer. I have referred you to medical oncology. You will get a call to get this scheduled.  They will take great care of you at the cancer center.  They will give you options for treatment, and they will tell you if your options are curative or palliative. We will treat your upper respiratory infection with an antibiotic. Doxycycline 100 mg  twice daily x 7 days. Take with probiotic or Activia yogurt. We will walk you today to see if you qualify for oxygen. Sats need to drop to 88% of below to qualify for oxygen. I will also send in albuterol  nebulizer medication. I will order a nebulizer machine from a DME so you can use the neb treatments. We will do a nebulizer treatment today in the office.  This should help with your breathing. I have also ordered an MRI brain to complete staging.  PET scan results are in EPIC.  Follow up as needed for breathing issues.  Metta Sours with your treatment.  Please contact office for sooner follow up if symptoms do not improve or worsen or seek emergency care

## 2024-03-03 NOTE — Telephone Encounter (Signed)
 Scheduled appointments per referral. Talked with the patient's wife and she is aware of the appointment time and date as well as the address. Patient was informed to arrive 10-15 minutes prior with updated insurance information. Patient was also informed that two guests are permitted but needs to be at least 80 years of age. All questions were answered.

## 2024-03-03 NOTE — Progress Notes (Signed)
 History of Present Illness Jacob Mckenzie is a 78 y.o. male former smoker( Quit 2016 with a 110 pack year smoking history) referred by Dr. Frederik for a 10 x 8.9 cm large mass of the medial left upper lobe. He will be followed by Dr. Zaida.    03/03/2024 Discussed the use of AI scribe software for clinical note transcription with the patient, who gave verbal consent to proceed.  History of Present Illness Jacob Mckenzie is an 78 year old male with COPD who presents with a lung mass requiring biopsy. He is accompanied by his wife. He was referred by Dr. Frederik.   A lung mass was identified during evaluations at [facility] and [facility]. The mass is large, measuring approximately 10 by 9 centimeters, located on the left side, and involves about half of the left lung.   He has experienced significant weight loss, dropping from 181 pounds in April to 143 pounds currently. The weight loss began in late May, coinciding with severe diarrhea. A CT scan performed by a GI doctor revealed lesions in the intestines and spleen, but no definitive cause for the diarrhea was identified. Imodium has been effective in controlling the diarrhea.  We reviewed his CT Chest and determined at his 02/17/2024 he needed to proceed with bronchoscopy with biopsies. He was in agreement with this plan. He underwent navigational bronchoscopy with biopsies on 02/26/2024. He is here today to review the results of his cytology, and to ensure he has done well after the procedure.  Pt. States he did well post procedure. No bleeding, fever, worsening dyspnea or adverse reaction to anesthesia.  He appears very frail today.  He has a very congested sounding cough and oxygen saturations this morning are 89%.  Per his wife oxygen saturations were also an issue in the hospital prior to his bronchoscopy.  He was given nebulizer treatments and placed on oxygen and his oxygen saturations at that point did respond to within normal limits.  He  states his secretions are clear, he does endorse shortness of breath.  We have reviewed his cytology. 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 SUV was 12.  There is ground glass attenuation involving the left upper lobe and lingula as well as the interlobular septal thickening most consistent with lymphangitic carcinomatosis.  There is postobstructive pneumonia due to bronchial narrowing that may contribute to these opacities.  There were also findings in the left paratracheal, left hilar lymph nodes with an SUV max of 2.4 likely metastatic.  Likewise there were concerning findings as there was a hypermetabolic splenic mass with SUV of 19.9 .  There was also notation of a 1 cm focal metabolic activity in the left prostate lobe with an SUV of 5.7.  And a hypermetabolic left adrenal nodule with an SUV of 3.2 .   Plan will be for urgent referral to medical oncology, and MR Brain  to complete staging. I did discuss with the patient and family that this was likely a high stage cancer and that they would have decisions to make regarding their treatment options.  I believe the family understand the significance of the findings on PET and biopsy, however I am unsure if the patient is in denial.  As patient is quite frail today I have  prescribed doxycycline 100 mg twice daily for a week.  We also did an albuterol  nebulizer treatment today in the room.  Per auscultation he has much better breath sounds posttreatment.  I have ordered albuterol  nebs as well as a nebulizer machine for the patient to use at home as needed for shortness of breath or wheezing up to 4 times daily.  We walked the patient in the office today.  I was trying to qualify him for oxygen as I felt he would meet criteria.  After a relatively long walk  saturations dropped to 89% but did not drop below that point.  Patient was too frail to walk up and down the stairs. He will need to be monitored closely, and rewalked to determine if you qualifies for oxygen therapy. I have asked them to return to the office for any breathing issues.   Once his chest is clear, we need to consider adding Breztri as maintenance.      Test Results: Cytology 02/26/2024 A. LUNG, LUL, FINE NEEDLE ASPIRATION AND BIOPSIES:  - Small cell carcinoma.  The carcinoma is positive for TTF-1, synaptophysin, and chromogranin  (focal) and is negative for p40 and Napsin.  This pattern of  immunoreactivity supports the above diagnosis.   B. LYMPH NODE, 7, FINE NEEDLE ASPIRATION:  - Negative for malignancy.  - Lymphoid material consistent with sampling of a lymph node.   Comment:  TTF-1 and p40 IHC stains were performed on the cell block and fail to  identify a subtle/crushed population of neoplastic cells. This pattern  of immunoreactivity supports the above diagnosis. Stain controls worked  appropriately.     Latest Ref Rng & Units 02/26/2024    9:35 AM 06/17/2015    2:37 AM 06/14/2015    9:04 PM  CBC  WBC 4.0 - 10.5 K/uL 6.2  12.7  9.3   Hemoglobin 13.0 - 17.0 g/dL 87.7  87.0  84.2   Hematocrit 39.0 - 52.0 % 39.6  39.2  48.3   Platelets 150 - 400 K/uL 213  233  281        Latest Ref Rng & Units 02/26/2024    9:35 AM 06/17/2015    2:37 AM 06/16/2015    7:00 PM  BMP  Glucose 70 - 99 mg/dL 87  895  846   BUN 8 - 23 mg/dL 7  11  10    Creatinine 0.61 - 1.24 mg/dL 9.35  9.36  9.23   Sodium 135 - 145 mmol/L 139  142  140   Potassium 3.5 - 5.1 mmol/L 3.7  4.3  4.3   Chloride 98 - 111 mmol/L 105  109  108   CO2 22 - 32 mmol/L 26  26  24    Calcium  8.9 - 10.3 mg/dL 9.3  9.0  9.4     BNP    Component Value Date/Time   BNP 175.9 (H) 06/14/2015 2236    ProBNP No results found for: PROBNP  PFT No results found for: FEV1PRE, FEV1POST, FVCPRE,  FVCPOST, TLC, DLCOUNC, PREFEV1FVCRT, PSTFEV1FVCRT  DG Chest Port 1 View Result Date: 02/26/2024 CLINICAL DATA:  Left lung mass.  Status post bronchoscopy. EXAM: PORTABLE CHEST 1 VIEW COMPARISON:  CT chest 01/29/2024.  PET scan 02/23/2024. FINDINGS: No pneumothorax identified after bronchoscopy. Large left upper lobe mass abutting the mediastinum as seen by recent imaging. Adjacent parenchymal opacity in the left upper lobe and lingula. Probable small left pleural effusion. No pulmonary edema in the contralateral right lung. IMPRESSION:  No pneumothorax identified after bronchoscopy. Large left upper lobe mass abutting the mediastinum. Adjacent parenchymal opacity in the left upper lobe and lingula. Probable small left pleural effusion. Electronically Signed   By: Marcey Moan M.D.   On: 02/26/2024 14:16   NM PET Image Initial (PI) Skull Base To Thigh Result Date: 02/23/2024 CLINICAL DATA:  Initial treatment strategy for lung nodule. EXAM: NUCLEAR MEDICINE PET SKULL BASE TO THIGH TECHNIQUE: 7.16 mCi F-18 FDG was injected intravenously. Full-ring PET imaging was performed from the skull base to thigh after the radiotracer. CT data was obtained and used for attenuation correction and anatomic localization. Fasting blood glucose: 82 mg/dl COMPARISON:  Chest CT January 29, 2024 FINDINGS: Mediastinal blood pool activity: SUV max 1.9 Liver activity: SUV max 2 NECK: See below.  No lymphadenopathy. Incidental CT findings: None. CHEST: There is a large necrotic mass in left upper lobe along the left paramediastinal measuring 9.8 x 9.1x 13 cm with max SUV 12, previously measured 10 x 8.9 cm on CT. Ground-glass attenuation involving the left upper lobe/lingula and interlobular septal thickening most consistent with lymphangitic carcinomatosis. Post obstructive pneumonia due to bronchial narrowing may contribute to these opacities. Subcentimeter and pericentimeter prevascular, left paratracheal, left hilar and  AP window mildly hypermetabolic lymph nodes max SUV 2.4, likely metastatic. Moderate left-sided pleural effusion. Incidental CT findings: None. ABDOMEN/PELVIS: Hypermetabolic splenic mass with central necrosis measuring 4.1 x 3.9 cm with max SUV 19.9. Hypermetabolic left adrenal nodule measuring with max SUV 3.2. Indeterminate 1 cm focal metabolic activity of the left prostate lobe measuring with max SUV 5.7. No definite CT finding on current noncontrast CT. Incidental CT findings: Prostatomegaly. 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 Sigmoid diverticulosis without diverticulitis. Right kidney cortical lesion measuring 16 mm. Perinephric hypoattenuating cystic structure measuring 5.6 x 3.9 cm on the right without significant FDG uptake. Left adrenal nodule measuring 1.5 cm max SUV 2.7. Hypodense subcentimeter liver lesions in segment 4 without significant FDG uptake likely cysts. SKELETON: Increased FDG uptake associated with bilateral sternocleidomastoid, muscles and diaphragmatic crura suggestive of labored breathing. Incidental CT findings: None. IMPRESSION: Large necrotic mass in left upper lobe consistent with malignancy until proven otherwise, stable to prior CT. 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 Suggestion of N2 nodal disease. Left-sided moderate pleural effusion. Recommend tissue sampling if not yet performed. Hypermetabolic splenic mass consistent with metastasis until proven otherwise. Left adrenal nodule with mild FDG uptake, indeterminate. Aneurysmal dilation of abdominal aorta, stable to prior. Prostatomegaly with focal metabolic activity of left lobe. Recommend correlation PSA levels and dedicated MRI prostate to exclude primary prostate malignancy. Electronically Signed   By: Megan  Zare M.D.   On: 02/23/2024 19:25     Past medical hx Past Medical  History:  Diagnosis Date   AAA (abdominal aortic aneurysm)    3.9 cm 10/2023 CT   Arthritis    COPD (chronic obstructive pulmonary disease) (HCC)    Stroke (HCC)    Tobacco abuse      Social History   Tobacco Use   Smoking status: Former    Current packs/day: 0.00    Average packs/day: 2.0 packs/day for 55.0 years (110.0 ttl pk-yrs)    Types: Cigarettes    Start date: 06/12/1960    Quit date: 06/13/2015    Years since quitting: 8.7    Passive exposure: Past   Smokeless tobacco: Never  Vaping Use   Vaping  status: Never Used  Substance Use Topics   Alcohol use: Yes    Alcohol/week: 2.0 standard drinks of alcohol    Types: 1 Cans of beer, 1 Shots of liquor per week    Comment: occasionally   Drug use: No    Mr.Blackerby reports that he quit smoking about 8 years ago. His smoking use included cigarettes. He started smoking about 63 years ago. He has a 110 pack-year smoking history. He has been exposed to tobacco smoke. He has never used smokeless tobacco. He reports current alcohol use of about 2.0 standard drinks of alcohol per week. He reports that he does not use drugs.  Tobacco Cessation: Counseling given: Not Answered   Past surgical hx, Family hx, Social hx all reviewed.  Current Outpatient Medications on File Prior to Visit  Medication Sig   albuterol  (PROVENTIL  HFA;VENTOLIN  HFA) 108 (90 Base) MCG/ACT inhaler Inhale 1-2 puffs into the lungs every 4 (four) hours as needed for wheezing or shortness of breath (ICD10code  J44.9).   atorvastatin  (LIPITOR) 40 MG tablet Take 1 tablet (40 mg total) by mouth at bedtime.   clopidogrel  (PLAVIX ) 75 MG tablet Take 1 tablet (75 mg total) by mouth daily.   metoprolol  tartrate (LOPRESSOR ) 25 MG tablet Take 1 tablet (25 mg total) by mouth 2 (two) times daily.   Multiple Vitamins-Minerals (MULTIVITAMIN & MINERAL PO) Take 1 tablet by mouth daily.   No current facility-administered medications on file prior to visit.     No Known  Allergies  Review Of Systems:  Constitutional:   +  weight loss, night sweats,  Fevers, chills+fatigue, or  lassitude.  HEENT:   No headaches,  Difficulty swallowing,  Tooth/dental problems, or  Sore throat,                No sneezing, itching, ear ache, nasal congestion, post nasal drip,   CV:  No chest pain,  Orthopnea, PND, swelling in lower extremities, anasarca, dizziness, palpitations, syncope.   GI  No heartburn, indigestion, abdominal pain, nausea, vomiting, diarrhea, change in bowel habits, loss of appetite, bloody stools.   Resp: + shortness of breath with exertion less at rest.  + excess mucus, + productive cough,  No non-productive cough,  No coughing up of blood.  No change in color of mucus.  + wheezing.  No chest wall deformity  Skin: no rash or lesions.  GU: no dysuria, change in color of urine, no urgency or frequency.  No flank pain, no hematuria   MS:  No joint pain or swelling.  + decreased range of motion.  No back pain.  Psych:  No change in mood or affect. No depression or anxiety.  No memory loss.   Vital Signs BP 126/73   Pulse (!) 111   Temp 97.6 F (36.4 C) (Oral)   Ht 5' 10 (1.778 m)   Wt 139 lb 3.2 oz (63.1 kg)   SpO2 (!) 89%   BMI 19.97 kg/m    Physical Exam:  General- No distress,  A&Ox3, pleasant frail gentleman in wheelchair ENT: No sinus tenderness, TM clear, pale nasal mucosa, no oral exudate,no post nasal drip, no LAN Cardiac: S1, S2, regular rate and rhythm, no murmur Chest: No wheeze/ rales/ dullness; no accessory muscle use, no nasal flaring, no sternal retractions, very distant BS bilaterally, better after neb treatment Abd.: Soft Non-tender, ND, BS +, Body mass index is 19.97 kg/m.  Ext: No clubbing cyanosis, edema, no obvious deformities Neuro:  physical deconditioning,  MAE x 4, A&O x 3 Skin: No rashes, warm and dry, no obvious skin lesions  Psych: normal mood and behavior  Assessment & Plan New diagnosis small cell lung  cancer left upper lobe pulmonary mass PET scan with evidence of lymphogenic metastasis as well as splenic, left adrenal and prostate hypermetabolism. Congestion with productive cough Oxygen saturation of 89% on arrival to the office today on room air Per family history of wheezing although not wheezing today at my evaluation Post bronchoscopy with biopsies Plan I am glad you have done well since the procedure.  The biopsy shows small cell lung cancer. I have referred you to medical oncology. You will get a call to get this scheduled.  They will take great care of you at the cancer center.  They will give you options for treatment, and they will tell you if your options are curative or palliative. We will treat your upper respiratory infection with an antibiotic. Doxycycline 100 mg  twice daily x 7 days. Take with probiotic or Activia yogurt. We will walk you today to see if you qualify for oxygen. Sats need to drop to 88% of below to qualify for oxygen. I will also send in albuterol  nebulizer medication. I will order a nebulizer machine from a DME so you can use the neb treatments. We will do a nebulizer treatment today in the office.  This should help with your breathing. I have also ordered an MRI brain to complete staging.  PET scan results are in EPIC.  Follow up as needed for breathing issues.  Metta Sours with your treatment.  Please contact office for sooner follow up if symptoms do not improve or worsen or seek emergency care    I spent 50 minutes dedicated to the care of this patient on the date of this encounter to include pre-visit review of records, face-to-face time with the patient discussing conditions above, post visit ordering of testing, clinical documentation with the electronic health record, making appropriate referrals as documented, and communicating necessary information to the patient's healthcare team.      Lauraine JULIANNA Lites, NP 03/03/2024  1:39  PM

## 2024-03-04 ENCOUNTER — Other Ambulatory Visit: Payer: Self-pay

## 2024-03-05 NOTE — Progress Notes (Signed)
 The proposed treatment discussed in conference is for discussion purpose only and is not a binding recommendation.  The patients have not been physically examined, or presented with their treatment options.  Therefore, final treatment plans cannot be decided.

## 2024-03-08 ENCOUNTER — Other Ambulatory Visit: Payer: Self-pay | Admitting: Medical Oncology

## 2024-03-08 ENCOUNTER — Inpatient Hospital Stay: Attending: Internal Medicine | Admitting: Internal Medicine

## 2024-03-08 ENCOUNTER — Inpatient Hospital Stay

## 2024-03-08 VITALS — BP 136/78 | HR 78 | Temp 96.8°F | Resp 17 | Ht 70.0 in | Wt 139.0 lb

## 2024-03-08 DIAGNOSIS — J90 Pleural effusion, not elsewhere classified: Secondary | ICD-10-CM | POA: Insufficient documentation

## 2024-03-08 DIAGNOSIS — R918 Other nonspecific abnormal finding of lung field: Secondary | ICD-10-CM

## 2024-03-08 DIAGNOSIS — Z803 Family history of malignant neoplasm of breast: Secondary | ICD-10-CM | POA: Diagnosis not present

## 2024-03-08 DIAGNOSIS — C7889 Secondary malignant neoplasm of other digestive organs: Secondary | ICD-10-CM | POA: Diagnosis not present

## 2024-03-08 DIAGNOSIS — R634 Abnormal weight loss: Secondary | ICD-10-CM | POA: Insufficient documentation

## 2024-03-08 DIAGNOSIS — Z87891 Personal history of nicotine dependence: Secondary | ICD-10-CM | POA: Diagnosis not present

## 2024-03-08 DIAGNOSIS — Z79899 Other long term (current) drug therapy: Secondary | ICD-10-CM | POA: Insufficient documentation

## 2024-03-08 DIAGNOSIS — C3412 Malignant neoplasm of upper lobe, left bronchus or lung: Secondary | ICD-10-CM | POA: Diagnosis not present

## 2024-03-08 DIAGNOSIS — R0609 Other forms of dyspnea: Secondary | ICD-10-CM | POA: Insufficient documentation

## 2024-03-08 DIAGNOSIS — K529 Noninfective gastroenteritis and colitis, unspecified: Secondary | ICD-10-CM | POA: Insufficient documentation

## 2024-03-08 DIAGNOSIS — Z8673 Personal history of transient ischemic attack (TIA), and cerebral infarction without residual deficits: Secondary | ICD-10-CM | POA: Insufficient documentation

## 2024-03-08 DIAGNOSIS — C778 Secondary and unspecified malignant neoplasm of lymph nodes of multiple regions: Secondary | ICD-10-CM | POA: Diagnosis not present

## 2024-03-08 DIAGNOSIS — J449 Chronic obstructive pulmonary disease, unspecified: Secondary | ICD-10-CM | POA: Diagnosis not present

## 2024-03-08 LAB — CMP (CANCER CENTER ONLY)
ALT: 12 U/L (ref 0–44)
AST: 51 U/L — ABNORMAL HIGH (ref 15–41)
Albumin: 3.4 g/dL — ABNORMAL LOW (ref 3.5–5.0)
Alkaline Phosphatase: 87 U/L (ref 38–126)
Anion gap: 3 — ABNORMAL LOW (ref 5–15)
BUN: 8 mg/dL (ref 8–23)
CO2: 32 mmol/L (ref 22–32)
Calcium: 10.5 mg/dL — ABNORMAL HIGH (ref 8.9–10.3)
Chloride: 108 mmol/L (ref 98–111)
Creatinine: 0.68 mg/dL (ref 0.61–1.24)
GFR, Estimated: >60 mL/min (ref >60)
Glucose, Bld: 88 mg/dL (ref 70–99)
Potassium: 5.1 mmol/L (ref 3.5–5.1)
Sodium: 143 mmol/L (ref 135–145)
Total Bilirubin: 0.4 mg/dL (ref 0.0–1.2)
Total Protein: 6.3 g/dL — ABNORMAL LOW (ref 6.5–8.1)

## 2024-03-08 LAB — CBC WITH DIFFERENTIAL (CANCER CENTER ONLY)
Abs Immature Granulocytes: 0.01 K/uL (ref 0.00–0.07)
Basophils Absolute: 0 K/uL (ref 0.0–0.1)
Basophils Relative: 0 %
Eosinophils Absolute: 0 K/uL (ref 0.0–0.5)
Eosinophils Relative: 1 %
HCT: 39.1 % (ref 39.0–52.0)
Hemoglobin: 12.1 g/dL — ABNORMAL LOW (ref 13.0–17.0)
Immature Granulocytes: 0 %
Lymphocytes Relative: 10 %
Lymphs Abs: 0.8 K/uL (ref 0.7–4.0)
MCH: 28.2 pg (ref 26.0–34.0)
MCHC: 30.9 g/dL (ref 30.0–36.0)
MCV: 91.1 fL (ref 80.0–100.0)
Monocytes Absolute: 0.7 K/uL (ref 0.1–1.0)
Monocytes Relative: 8 %
Neutro Abs: 6.3 K/uL (ref 1.7–7.7)
Neutrophils Relative %: 81 %
Platelet Count: 249 K/uL (ref 150–400)
RBC: 4.29 MIL/uL (ref 4.22–5.81)
RDW: 15.3 % (ref 11.5–15.5)
WBC Count: 7.8 K/uL (ref 4.0–10.5)
nRBC: 0 % (ref 0.0–0.2)

## 2024-03-08 MED ORDER — ONDANSETRON HCL 8 MG PO TABS: 8.0000 mg | ORAL_TABLET | Freq: Three times a day (TID) | ORAL | 1 refills | Status: DC | PRN

## 2024-03-08 MED ORDER — PROCHLORPERAZINE MALEATE 10 MG PO TABS: 10.0000 mg | ORAL_TABLET | Freq: Four times a day (QID) | ORAL | 1 refills | Status: DC | PRN

## 2024-03-08 MED ORDER — LIDOCAINE-PRILOCAINE 2.5-2.5 % EX CREA: TOPICAL_CREAM | CUTANEOUS | 3 refills | Status: DC

## 2024-03-08 NOTE — Progress Notes (Signed)
 Pharmacist Chemotherapy Monitoring - Initial Assessment    Anticipated start date: 03/15/24   The following has been reviewed per standard work regarding the patient's treatment regimen: The patient's diagnosis, treatment plan and drug doses, and organ/hematologic function Lab orders and baseline tests specific to treatment regimen  The treatment plan start date, drug sequencing, and pre-medications Prior authorization status  Patient's documented medication list, including drug-drug interaction screen and prescriptions for anti-emetics and supportive care specific to the treatment regimen The drug concentrations, fluid compatibility, administration routes, and timing of the medications to be used The patient's access for treatment and lifetime cumulative dose history, if applicable  The patient's medication allergies and previous infusion related reactions, if applicable   Changes made to treatment plan:  N/A  Follow up needed:  Pending authorization for treatment    Wilma Dollar, Pharm.D., CPP 03/08/2024@4 :05 PM

## 2024-03-08 NOTE — Progress Notes (Unsigned)
 NN met the pt face to face at his consult with Dr. Sherrod. Pt was accompanied by his wife, Devere, his son [name], and his DIL sonja.  The pt and his family understand that he has very aggressive cancer, however the pt and family would like some time to discuss what would be the best course of action at this time. NN verbalized understanding.  Pt and family provided with lung cancer journey book. Information about the [facility] and the Lung cancer support group.  Pt NN provided pt and his family her direct contact information and encouraged calling with any questions. NN escorted pt to scheduling to arrange for a chemo education class. Pt requested that the appt be over the phone. Pt's wife and son are aware someone will need to stop by the clinic to pick up the education materials needed for the class.

## 2024-03-08 NOTE — Progress Notes (Signed)
 Lab orders entered

## 2024-03-08 NOTE — Progress Notes (Signed)
 START ON PATHWAY REGIMEN - Small Cell Lung     A cycle is every 21 days:     Durvalumab      Carboplatin      Etoposide   **Always confirm dose/schedule in your pharmacy ordering system**  Patient Characteristics: Newly Diagnosed, Preoperative or Nonsurgical Candidate (Clinical Staging), First Line, Extensive Stage Therapeutic Status: Newly Diagnosed, Preoperative or Nonsurgical Candidate (Clinical Staging) AJCC T Category: cT4 AJCC N Category: cN2b AJCC M Category: cM1b AJCC 9 Stage Grouping: IVA Check here if patient was staged using an edition other than AJCC Staging 9th Edition: false Stage Classification: Extensive Intent of Therapy: Non-Curative / Palliative Intent, Discussed with Patient

## 2024-03-08 NOTE — Progress Notes (Signed)
 [facility] [phone]   [phone]  CONSULT NOTE  REFERRING PHYSICIAN: Dr. Zola Herter  REASON FOR CONSULTATION:  78 years old white male recently diagnosed with lung cancer  HPI Jacob Mckenzie is a 78 y.o. male with past medical history significant for COPD, stroke, abdominal aortic aneurysm as well as osteoarthritis and long history of smoking.  The patient was complaining of shortness of breath and because of his history of COPD he had CT scanning of the chest with high-resolution on 01/29/2024 and it showed enlarged mediastinal lymph node in the AP window measuring up to 1.8 x 0.9 cm.  There was also large mass of the medial left upper lobe measuring 10.0 x 8.9 cm with large interface to the mediastinum.  The upper abdomen also showed left adrenal nodule measuring 1.9 x 1.8 cm suspicious for metastasis.  PET scan was performed on 02/23/2024 and that showed large necrotic mass in the left upper lobe consistent with malignancy.  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  There was suggestion of N2 nodal metastasis and left-sided moderate pleural effusion.  There was hypermetabolic splenic mass consistent with metastasis until proven otherwise.  The left adrenal nodule with mild FDG uptake was indeterminate.  There was also a prostatectomy with focal metabolic activity of the left lobe and the primary prostate malignancy could not be completely excluded.  The patient was referred to Dr. Herter and he underwent flexible bronchoscopy with EBUS on February 26, 2024.  The final pathology ([id number]) from the fine-needle aspiration of the left upper lobe was consistent with a small cell carcinoma. He was referred to me today for evaluation and recommendation regarding treatment of his condition.  HPI  Discussed the use of AI scribe software for clinical note transcription with the patient, who gave  verbal consent to proceed.  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 He is accompanied by his daughter-in-law [name] and son [name]. He was referred by a pulmonologist, Dr. Trula, for further evaluation of his small cell lung cancer.  He was diagnosed with small cell lung cancer after experiencing persistent diarrhea starting in late May to early June, which led to significant weight loss of about 40 pounds. Initial stool tests were negative, prompting further investigation by a gastroenterologist, who performed a CT scan of the abdomen that was negative. However, a subsequent CT scan of the lungs revealed a large mass in the left upper lung, leading to a referral to a pulmonologist.  A PET scan on February 23, 2024, showed a hypermetabolic mass in the left lung and suspicious areas in the spleen and prostate. A bronchoscopy and EBUS performed on February 26, 2024, were done as part of the workup for his lung mass. He is awaiting a brain MRI to assess for potential metastasis.  He has ongoing symptoms including bloating and discomfort in the stomach, persistent diarrhea described as 'rusty squirts', and significant weight loss. He also experiences shortness of breath but no chest pain or hemoptysis, except post-procedure. Occasionally, he feels dizzy but has no nausea, vomiting, or headaches.  His past medical history includes COPD, arthritis, a stroke in January 2017, and an abdominal aortic aneurysm. He takes metoprolol  25 mg twice daily for high blood pressure. He smoked for 50 years, quitting in 2017 after his stroke. He denies alcohol use and has no known drug allergies.  Family history is notable for a sister with breast cancer. His parents died of old age with no history of cancer.     Past Medical History:  Diagnosis Date   AAA (abdominal aortic aneurysm)    3.9 cm 10/2023 CT   Arthritis    COPD (chronic  obstructive pulmonary disease) (HCC)    Stroke (HCC)    Tobacco abuse       Past Surgical History:  Procedure Laterality Date   BRONCHIAL NEEDLE ASPIRATION BIOPSY  02/26/2024   Procedure: BRONCHOSCOPY, WITH NEEDLE ASPIRATION BIOPSY;  Surgeon: Zaida Zola SAILOR, MD;  Location: [facility];  Service: Pulmonary;;   TONSILLECTOMY     VIDEO BRONCHOSCOPY WITH ENDOBRONCHIAL ULTRASOUND  02/26/2024   Procedure: BRONCHOSCOPY, WITH EBUS;  Surgeon: Zaida Zola SAILOR, MD;  Location: [facility];  Service: Pulmonary;;    No family history on file.  Social History Social History   Tobacco Use   Smoking status: Former    Current packs/day: 0.00    Average packs/day: 2.0 packs/day for 55.0 years (110.0 ttl pk-yrs)    Types: Cigarettes    Start date: 06/12/1960    Quit date: 06/13/2015    Years since quitting: 8.7    Passive exposure: Past   Smokeless tobacco: Never  Vaping Use   Vaping status: Never Used  Substance Use Topics   Alcohol use: Yes    Alcohol/week: 2.0 standard drinks of alcohol    Types: 1 Cans of beer, 1 Shots of liquor per week    Comment: occasionally   Drug use: No    No Known Allergies  Current Outpatient Medications  Medication Sig Dispense Refill   albuterol  (PROVENTIL  HFA;VENTOLIN  HFA) 108 (90 Base) MCG/ACT inhaler Inhale 1-2 puffs into the lungs every 4 (four) hours as needed for wheezing or shortness of breath (ICD10code  J44.9). 1 Inhaler 5   albuterol  (PROVENTIL ) (2.5 MG/3ML) 0.083% nebulizer solution Take 3 mLs (2.5 mg total) by nebulization every 6 (six) hours as needed for wheezing or shortness of breath. 84 each 12   atorvastatin  (LIPITOR) 40 MG tablet Take 1 tablet (40 mg total) by mouth at bedtime. 30 tablet 4   clopidogrel  (PLAVIX ) 75 MG tablet Take 1 tablet (75 mg total) by mouth daily. 30 tablet 5   doxycycline (VIBRA-TABS) 100 MG tablet Take 1 tablet (100 mg total) by mouth 2 (two) times daily. 14 tablet 0   metoprolol  tartrate (LOPRESSOR ) 25 MG tablet  Take 1 tablet (25 mg total) by mouth 2 (two) times daily. 60 tablet 3   Multiple Vitamins-Minerals (MULTIVITAMIN & MINERAL PO) Take 1 tablet by mouth daily.     No current facility-administered medications for this visit.    Review of Systems  Constitutional: positive for anorexia, fatigue, and weight loss Eyes: negative Ears, nose, mouth, throat, and face: negative Respiratory: positive for dyspnea on exertion Cardiovascular: negative Gastrointestinal: positive for diarrhea Genitourinary:negative Integument/breast: negative Hematologic/lymphatic: negative Musculoskeletal:negative Neurological: negative Behavioral/Psych: negative Endocrine: negative Allergic/Immunologic: negative  Physical Exam  MJO:jozmu, healthy, no distress, ill looking, and malnourished SKIN: skin color, texture, turgor are normal, no rashes or significant lesions HEAD: Normocephalic, No masses, lesions, tenderness or abnormalities EYES: normal, PERRLA, Conjunctiva are pink and non-injected EARS: External ears normal, Canals clear OROPHARYNX:no exudate, no erythema, and lips, buccal mucosa, and tongue normal  NECK: supple, no adenopathy, no JVD LYMPH:  no palpable lymphadenopathy, no hepatosplenomegaly LUNGS: coarse sounds heard, decreased breath sounds HEART: regular rate & rhythm, no murmurs, and no gallops ABDOMEN:abdomen  soft, non-tender, normal bowel sounds, and no masses or organomegaly BACK: Back symmetric, no curvature., No CVA tenderness EXTREMITIES:no joint deformities, effusion, or inflammation, no edema  NEURO: alert & oriented x 3 with fluent speech, no focal motor/sensory deficits  PERFORMANCE STATUS: ECOG 1-2  LABORATORY DATA: Lab Results  Component Value Date   WBC 6.2 02/26/2024   HGB 12.2 (L) 02/26/2024   HCT 39.6 02/26/2024   MCV 92.3 02/26/2024   PLT 213 02/26/2024      Chemistry      Component Value Date/Time   NA 139 02/26/2024 0935   K 3.7 02/26/2024 0935   CL 105  02/26/2024 0935   CO2 26 02/26/2024 0935   BUN 7 (L) 02/26/2024 0935   CREATININE 0.64 02/26/2024 0935      Component Value Date/Time   CALCIUM  9.3 02/26/2024 0935   ALKPHOS 64 06/16/2015 1900   AST 30 06/16/2015 1900   ALT 13 (L) 06/16/2015 1900   BILITOT 0.3 06/16/2015 1900       RADIOGRAPHIC STUDIES: DG Chest Port 1 View Result Date: 02/26/2024 CLINICAL DATA:  Left lung mass.  Status post bronchoscopy. EXAM: PORTABLE CHEST 1 VIEW COMPARISON:  CT chest 01/29/2024.  PET scan 02/23/2024. FINDINGS: No pneumothorax identified after bronchoscopy. Large left upper lobe mass abutting the mediastinum as seen by recent imaging. Adjacent parenchymal opacity in the left upper lobe and lingula. Probable small left pleural effusion. No pulmonary edema in the contralateral right lung. IMPRESSION: No pneumothorax identified after bronchoscopy. Large left upper lobe mass abutting the mediastinum. Adjacent parenchymal opacity in the left upper lobe and lingula. Probable small left pleural effusion. Electronically Signed   By: Marcey Moan M.D.   On: 02/26/2024 14:16   NM PET Image Initial (PI) Skull Base To Thigh Result Date: 02/23/2024 CLINICAL DATA:  Initial treatment strategy for lung nodule. EXAM: NUCLEAR MEDICINE PET SKULL BASE TO THIGH TECHNIQUE: 7.16 mCi F-18 FDG was injected intravenously. Full-ring PET imaging was performed from the skull base to thigh after the radiotracer. CT data was obtained and used for attenuation correction and anatomic localization. Fasting blood glucose: 82 mg/dl COMPARISON:  Chest CT January 29, 2024 FINDINGS: Mediastinal blood pool activity: SUV max 1.9 Liver activity: SUV max 2 NECK: See below.  No lymphadenopathy. Incidental CT findings: None. CHEST: There is a large necrotic mass in left upper lobe along the left paramediastinal measuring 9.8 x 9.1x 13 cm with max SUV 12, previously measured 10 x 8.9 cm on CT. Ground-glass attenuation involving the left upper  lobe/lingula and interlobular septal thickening most consistent with lymphangitic carcinomatosis. Post obstructive pneumonia due to bronchial narrowing may contribute to these opacities. Subcentimeter and pericentimeter prevascular, left paratracheal, left hilar and AP window mildly hypermetabolic lymph nodes max SUV 2.4, likely metastatic. Moderate left-sided pleural effusion. Incidental CT findings: None. ABDOMEN/PELVIS: Hypermetabolic splenic mass with central necrosis measuring 4.1 x 3.9 cm with max SUV 19.9. Hypermetabolic left adrenal nodule measuring with max SUV 3.2. Indeterminate 1 cm focal metabolic activity of the left prostate lobe measuring with max SUV 5.7. No definite CT finding on current noncontrast CT. Incidental CT findings: Prostatomegaly. 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 Sigmoid diverticulosis without diverticulitis. Right kidney cortical lesion measuring 16 mm. Perinephric hypoattenuating cystic structure measuring 5.6 x 3.9 cm on the right without significant FDG uptake. Left adrenal nodule measuring 1.5 cm max SUV 2.7. Hypodense subcentimeter liver lesions in segment 4 without significant FDG uptake likely cysts. SKELETON: Increased  FDG uptake associated with bilateral sternocleidomastoid, muscles and diaphragmatic crura suggestive of labored breathing. Incidental CT findings: None. IMPRESSION: Large necrotic mass in left upper lobe consistent with malignancy until proven otherwise, stable to prior CT. 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 Suggestion of N2 nodal disease. Left-sided moderate pleural effusion. Recommend tissue sampling if not yet performed. Hypermetabolic splenic mass consistent with metastasis until proven otherwise. Left adrenal nodule with mild FDG uptake, indeterminate. Aneurysmal dilation of abdominal aorta, stable to prior.  Prostatomegaly with focal metabolic activity of left lobe. Recommend correlation PSA levels and dedicated MRI prostate to exclude primary prostate malignancy. Electronically Signed   By: Megan  Zare M.D.   On: 02/23/2024 19:25    ASSESSMENT: This is a very pleasant 78 years old white male with likely extensive stage (T4, N2, M1b) small cell lung cancer presented with large left upper lobe lung mass with mediastinal invasion and suspicious lymphangitic spread as well as suspicious AP window and left hilar lymphadenopathy, moderate left pleural effusion in addition to hypermetabolic splenic mass with central necrosis diagnosed in October 2025.  He also has a focal metabolic activity of the left prostate lobe suspicious for primary prostate malignancy.   PLAN: I had a lengthy discussion with the patient and his family today about his current disease stage, prognosis and treatment options.  I personally independently reviewed the scan images as well as the pathology report and discussed the result with the patient and his family. The patient and his family understand that he has incurable condition and all the treatment will be of palliative nature with the goal of prolongation of his life and palliation of his symptoms. 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 Metastases are present in the spleen and possibly the prostate. The cancer is aggressive and fast-growing, requiring prompt treatment. Surgery is not an option due to the size and location of the tumor. The prognosis without treatment is poor, with a life expectancy of 6 weeks to 3 months, while treatment could extend life to around a year or longer. Chemotherapy and radiation are expected to shrink the tumor in 80% of  cases, but there is no cure. Hospice care is an option if treatment is declined. - Schedule brain MRI to assess for metastasis. - Refer to radiation oncology for 2-3 weeks of radiation therapy to shrink the tumor. - Administer chemotherapy: 3 days in a row every 3 weeks for 4 cycles. - Plan for immunotherapy after completion of radiation and chemotherapy. - Schedule chemo class for education on treatment. - Arrange for port placement if treatment is initiated. - Provide nausea medication and Imlicrim for port numbing if treatment is initiated.  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 Previous tests were negative for other causes. The diarrhea may resolve with cancer treatment if it is related to paraneoplastic syndrome.  Unintentional weight loss Significant weight loss of approximately 40 pounds since June, likely related to small cell lung cancer and associated symptoms such as diarrhea and decreased appetite.  Dyspnea Shortness of breath associated with large lung mass and possible airway obstruction. Radiation therapy is planned to shrink the tumor and improve airway  patency.  Chronic obstructive pulmonary disease (COPD) COPD may contribute to respiratory symptoms. He was advised to call immediately if he has any other concerning symptoms in the interval.  The patient voices understanding of current disease status and treatment options and is in agreement with the current care plan.  All questions were answered. The patient knows to call the clinic with any problems, questions or concerns. We can certainly see the patient much sooner if necessary.  Thank you so much for allowing me to participate in the care of Jathan Lamontagne. I will continue to follow up the patient with you and assist in his care.  The total time spent in the appointment was 90 minutes including review of chart  and various tests results, discussions about plan of care and coordination of care plan .   Disclaimer: This note was dictated with voice recognition software. Similar sounding words can inadvertently be transcribed and may not be corrected upon review.   Sherrod MARLA Sherrod March 08, 2024, 11:12 AM

## 2024-03-09 ENCOUNTER — Other Ambulatory Visit: Payer: Self-pay

## 2024-03-09 ENCOUNTER — Other Ambulatory Visit: Payer: Self-pay | Admitting: Acute Care

## 2024-03-09 ENCOUNTER — Other Ambulatory Visit: Payer: Self-pay | Admitting: Internal Medicine

## 2024-03-09 ENCOUNTER — Telehealth: Payer: Self-pay | Admitting: Radiation Oncology

## 2024-03-09 DIAGNOSIS — C3490 Malignant neoplasm of unspecified part of unspecified bronchus or lung: Secondary | ICD-10-CM

## 2024-03-09 MED ORDER — LORAZEPAM 0.5 MG PO TABS: ORAL_TABLET | ORAL | 0 refills | Status: AC

## 2024-03-09 NOTE — Progress Notes (Signed)
 NN reached out to the pt to notify him of the Brain MRI appt that has been scheduled. Pt's wife, Jacob Mckenzie answered the phone. Jacob Mckenzie states that the pt has decided not to pursue treatment. Pt was asleep at the time of the first call so decision could not be confirmed. NN states she will call back at a later time today.   NN made a second call to the pt. Jacob Mckenzie answered and put phone on speaker. Pt verbally confirmed that he would like to forego any treatment. NN verbalized understanding and offered to place a referral to home hospice. Pt states he isn't ready and will reach out to his PCM for a referral when he is. Pt and his wife verbalized appreciation for oncology team. NN let the pt and wife know they are free to reach out to NN if needed.

## 2024-03-09 NOTE — Telephone Encounter (Signed)
 Called pt to offer consult with Dr. Dewey to discuss radiation as option for tx. Pt's wife answered and advised she spoke with nurse navigator earlier today to cx all appts. I advised I saw note but wanted to make sure they understood chemo and radiation were two different treatment options, she stated yes. I asked if they were interested in even a consult to discuss radiation, pt's wife advised no, not at this time because [pt] feels like he's been put through enough and is not interested at this time. I advised I would send a message and close referral; I also advised if they change their minds in the future to feel free to reach out to our team. Pt's wife verbalized understanding.

## 2024-03-10 ENCOUNTER — Other Ambulatory Visit: Payer: Self-pay

## 2024-03-10 DIAGNOSIS — C3412 Malignant neoplasm of upper lobe, left bronchus or lung: Secondary | ICD-10-CM

## 2024-03-10 NOTE — Progress Notes (Signed)
 NN calls pt's PCM office to request a referral to hospice. NN spoke to Dr Morris nurse, Lonell. NN request Lonell return her call after she has spoken to Dr Frederik.

## 2024-03-10 NOTE — Progress Notes (Signed)
 NN received a call from Dana, Dr Morris nurse. Lonell states that Dr Frederik is happy to place the referral to [facility]. Lonell states that she will be sending the referral over today.   NN called pts wife Suszane. [place] is familiar with Lonell and says that she's glad because dana will get it done  NN requested Suszane call if she hasn't heard from [facility] in the next 24-48hrs. Suszane appreciates assistance. Will reach out if necessary.

## 2024-03-11 ENCOUNTER — Inpatient Hospital Stay

## 2024-03-12 ENCOUNTER — Telehealth: Payer: Self-pay | Admitting: Internal Medicine

## 2024-03-12 ENCOUNTER — Ambulatory Visit (HOSPITAL_COMMUNITY)

## 2024-03-12 NOTE — Telephone Encounter (Signed)
 Called the patient and spoke to the patients wife. Attempted to schedule appointments surrounding chemo treatments. The patients wife informed me that they are not proceeding with any treatment.

## 2024-03-15 ENCOUNTER — Inpatient Hospital Stay

## 2024-03-16 ENCOUNTER — Inpatient Hospital Stay

## 2024-03-17 ENCOUNTER — Ambulatory Visit (HOSPITAL_COMMUNITY): Admission: RE | Admit: 2024-03-17 | Source: Ambulatory Visit

## 2024-03-17 ENCOUNTER — Inpatient Hospital Stay

## 2024-03-19 ENCOUNTER — Inpatient Hospital Stay

## 2024-03-22 ENCOUNTER — Inpatient Hospital Stay

## 2024-03-22 ENCOUNTER — Inpatient Hospital Stay: Admitting: Physician Assistant

## 2024-03-30 ENCOUNTER — Ambulatory Visit: Admitting: Acute Care
